# Patient Record
Sex: Male | Born: 1995 | Race: White | Hispanic: No | Marital: Single | Smoking: Never smoker
Health system: Southern US, Community
[De-identification: ages and names within clinical notes are randomized; demographics above are authoritative.]

## PROBLEM LIST (undated history)

## (undated) DIAGNOSIS — E079 Disorder of thyroid, unspecified: Secondary | ICD-10-CM

## (undated) HISTORY — DX: Disorder of thyroid, unspecified: E07.9

---
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
## 2016-10-01 ENCOUNTER — Ambulatory Visit
Admission: RE | Admit: 2016-10-01 | Discharge: 2016-10-01 | Disposition: A | Discharge status: Home / Self Care | Payer: Medicaid Other | Source: Ambulatory Visit | Attending: Pediatrics | Admitting: Pediatrics

## 2016-10-01 ENCOUNTER — Other Ambulatory Visit: Payer: Self-pay | Admitting: Pediatrics

## 2016-10-01 ENCOUNTER — Other Ambulatory Visit
Admission: RE | Admit: 2016-10-01 | Discharge: 2016-10-01 | Disposition: A | Discharge status: Home / Self Care | Payer: Medicaid Other | Source: Ambulatory Visit | Attending: Pediatrics | Admitting: Pediatrics

## 2016-10-01 DIAGNOSIS — R220 Localized swelling, mass and lump, head: Secondary | ICD-10-CM | POA: Insufficient documentation

## 2016-10-01 LAB — COMPREHENSIVE METABOLIC PANEL
ALT: 17 U/L (ref 17–63)
ANION GAP: 6 (ref 5–15)
AST: 19 U/L (ref 15–41)
Albumin: 4.9 g/dL (ref 3.5–5.0)
Alkaline Phosphatase: 60 U/L (ref 38–126)
BUN: 5 mg/dL — ABNORMAL LOW (ref 6–20)
CHLORIDE: 106 mmol/L (ref 101–111)
CO2: 27 mmol/L (ref 22–32)
Calcium: 10.1 mg/dL (ref 8.9–10.3)
Creatinine, Ser: 0.91 mg/dL (ref 0.61–1.24)
GFR calc non Af Amer: >60 mL/min (ref >60)
Glucose, Bld: 104 mg/dL — ABNORMAL HIGH (ref 65–99)
POTASSIUM: 3.1 mmol/L — AB (ref 3.5–5.1)
SODIUM: 139 mmol/L (ref 135–145)
Total Bilirubin: 1.6 mg/dL — ABNORMAL HIGH (ref 0.3–1.2)
Total Protein: 7.8 g/dL (ref 6.5–8.1)

## 2016-10-01 LAB — CBC WITH DIFFERENTIAL/PLATELET
BASOS ABS: 0.1 10*3/uL (ref 0–0.1)
Basophils Relative: 1 %
EOS PCT: 2 %
Eosinophils Absolute: 0.2 10*3/uL (ref 0–0.7)
HEMATOCRIT: 45.8 % (ref 40.0–52.0)
HEMOGLOBIN: 15.7 g/dL (ref 13.0–18.0)
Lymphocytes Relative: 27 %
Lymphs Abs: 1.9 10*3/uL (ref 1.0–3.6)
MCH: 29.7 pg (ref 26.0–34.0)
MCHC: 34.2 g/dL (ref 32.0–36.0)
MCV: 86.9 fL (ref 80.0–100.0)
Monocytes Absolute: 0.6 10*3/uL (ref 0.2–1.0)
Monocytes Relative: 9 %
Neutro Abs: 4.2 10*3/uL (ref 1.4–6.5)
Neutrophils Relative %: 61 %
Platelets: 233 10*3/uL (ref 150–440)
RBC: 5.27 MIL/uL (ref 4.40–5.90)
RDW: 12.6 % (ref 11.5–14.5)
WBC: 6.9 10*3/uL (ref 3.8–10.6)

## 2016-10-01 LAB — T4, FREE: FREE T4: 0.81 ng/dL (ref 0.61–1.12)

## 2016-10-01 LAB — LIPID PANEL
CHOL/HDL RATIO: 3.2 ratio
Cholesterol: 159 mg/dL (ref 0–200)
HDL: 50 mg/dL (ref >40)
LDL CALC: 96 mg/dL (ref 0–99)
Triglycerides: 64 mg/dL (ref <150)
VLDL: 13 mg/dL (ref 0–40)

## 2016-10-01 LAB — TSH: TSH: 13.682 u[IU]/mL — ABNORMAL HIGH (ref 0.350–4.500)

## 2017-12-07 IMAGING — CR DG SKULL COMPLETE 4+V
4 series · 4 of 4 positions shown · non-contrast
Comparison: No prior.

CLINICAL DATA: Soft tissue knot.

EXAM:
SKULL - COMPLETE 4 + VIEW

[skull pa]
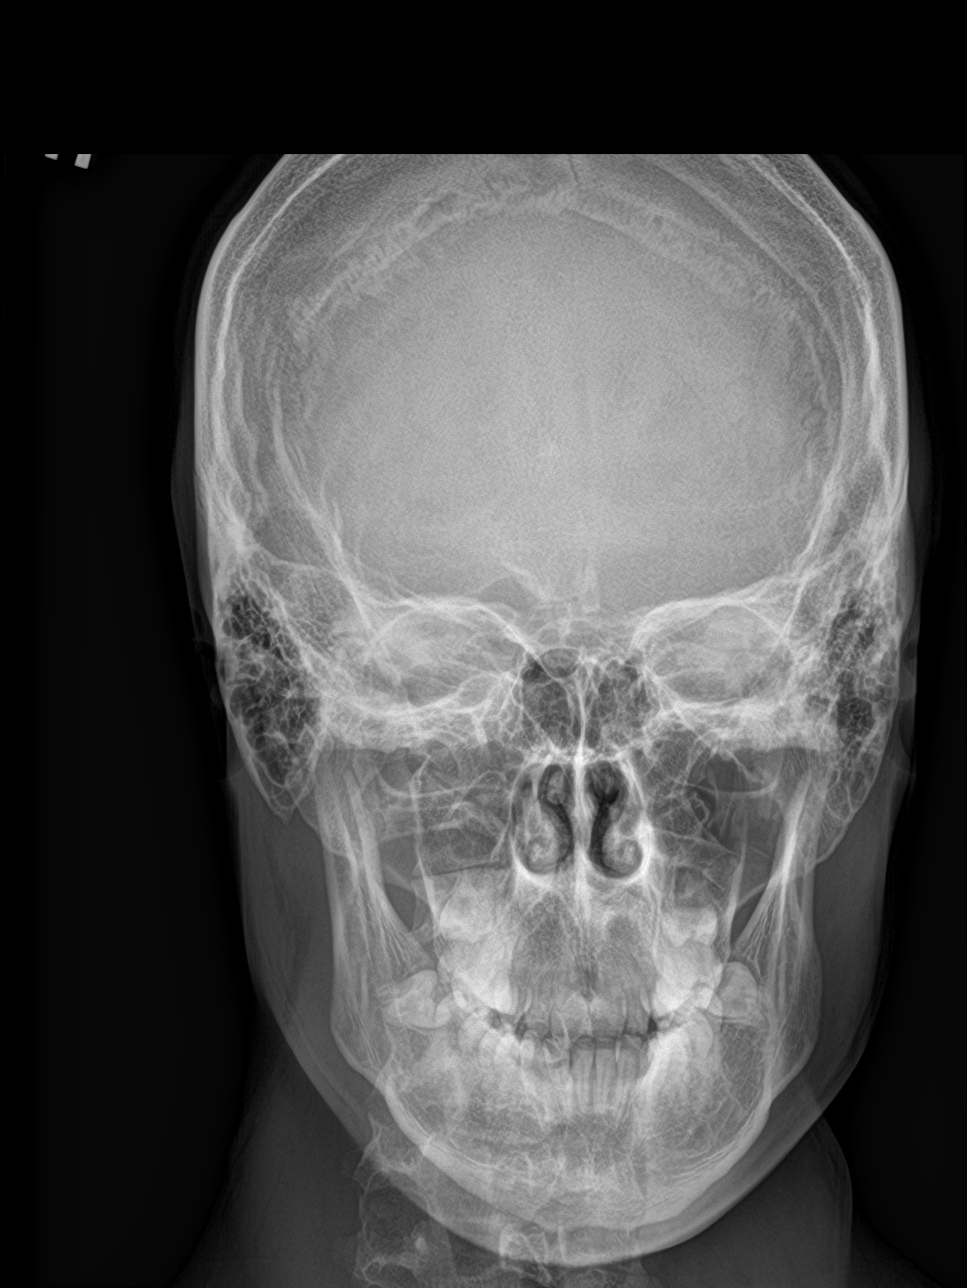

[skull lat (1 of 2)]
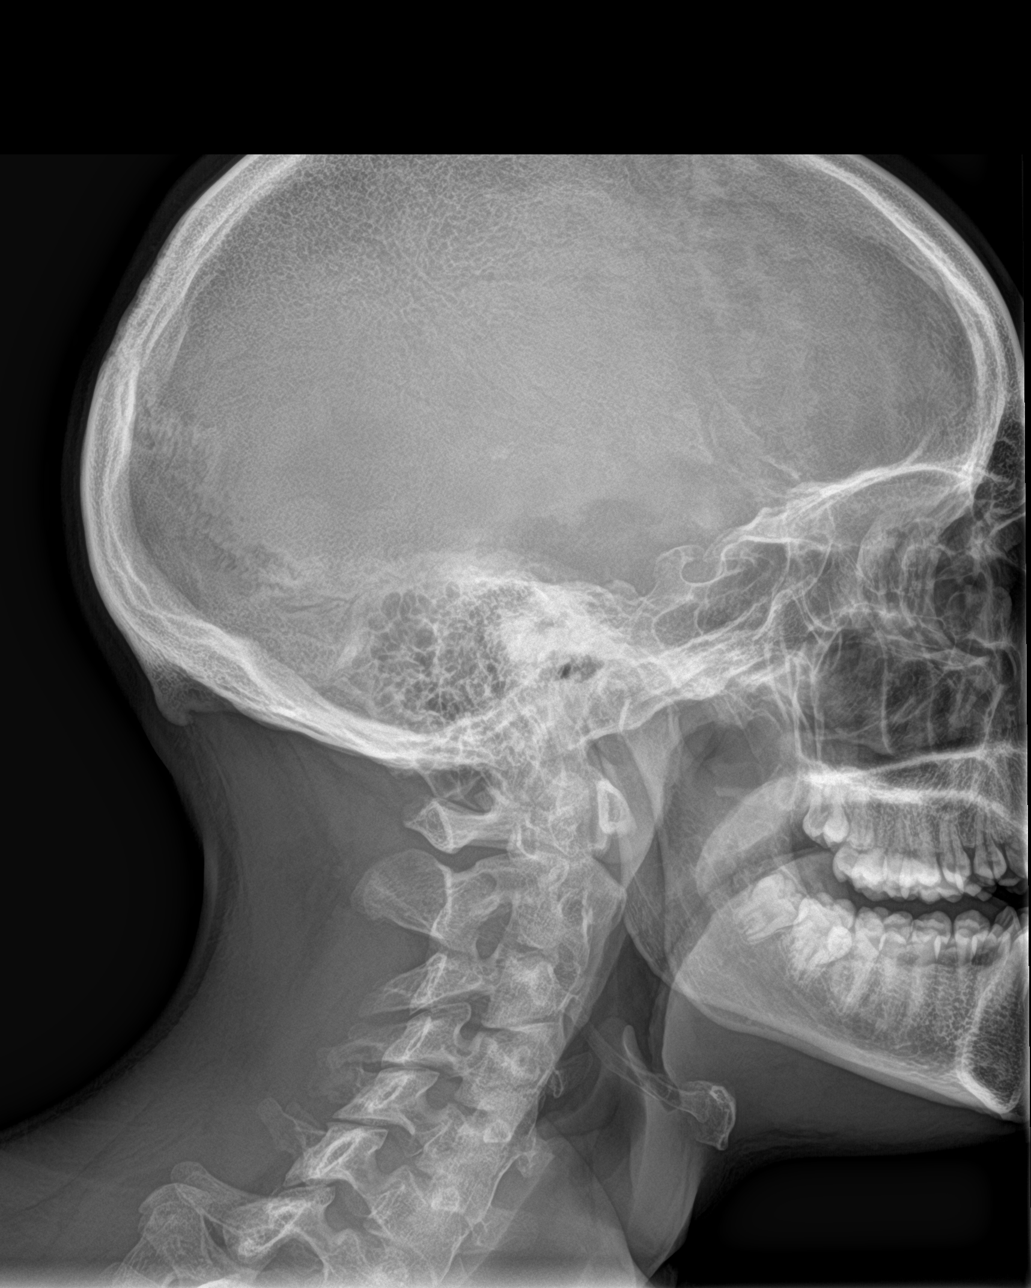

[skull towns]
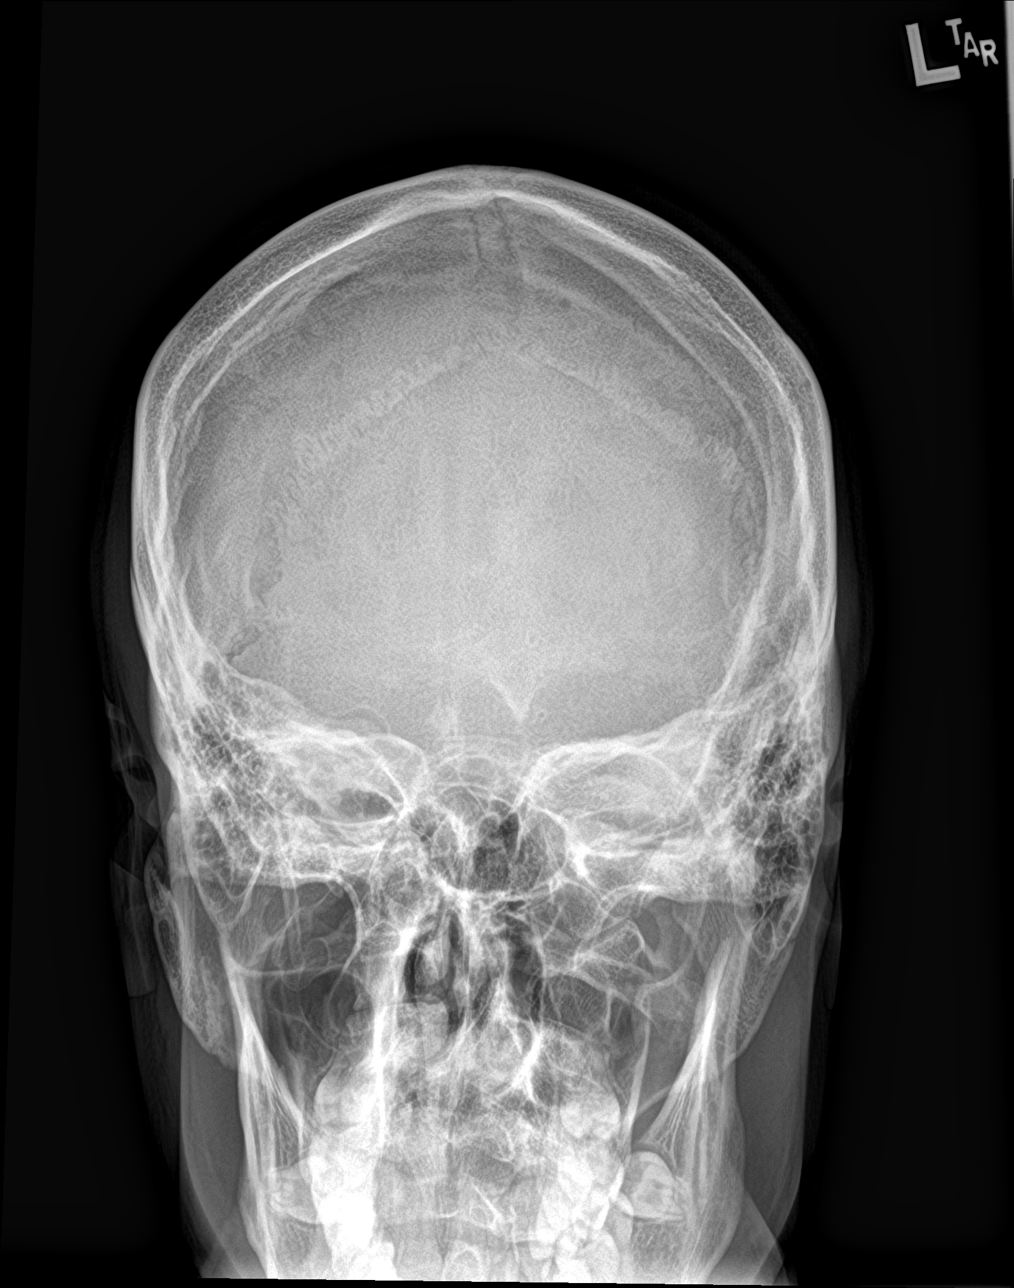

[skull lat (2 of 2)]
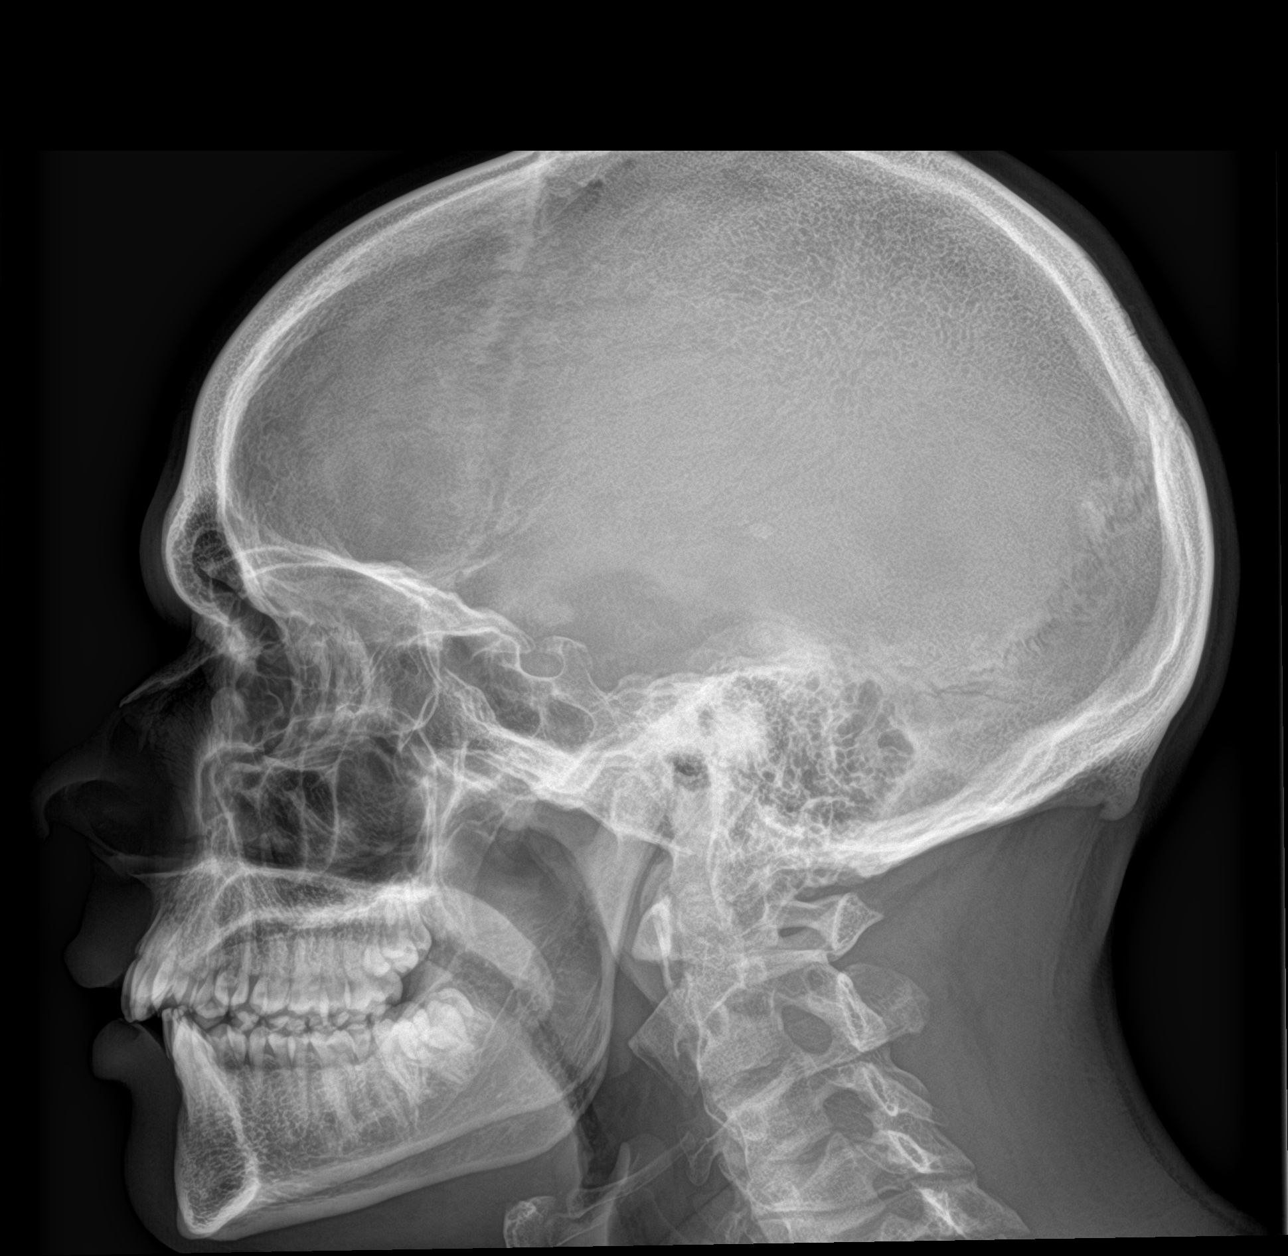

[4 of 4 positions shown; findings below may reference images not displayed]

FINDINGS: No acute soft tissue bony abnormality identified. No evidence of
fracture. No pathologic intracranial calcification. Paranasal
sinuses and mastoids are clear.
IMPRESSION: No acute abnormality..

## 2019-03-27 DIAGNOSIS — E031 Congenital hypothyroidism without goiter: Secondary | ICD-10-CM | POA: Diagnosis not present

## 2019-04-03 DIAGNOSIS — E031 Congenital hypothyroidism without goiter: Secondary | ICD-10-CM | POA: Diagnosis not present

## 2020-05-06 DIAGNOSIS — E031 Congenital hypothyroidism without goiter: Secondary | ICD-10-CM | POA: Diagnosis not present

## 2020-05-08 DIAGNOSIS — E031 Congenital hypothyroidism without goiter: Secondary | ICD-10-CM | POA: Diagnosis not present

## 2021-05-05 DIAGNOSIS — E031 Congenital hypothyroidism without goiter: Secondary | ICD-10-CM | POA: Diagnosis not present

## 2021-05-12 DIAGNOSIS — E031 Congenital hypothyroidism without goiter: Secondary | ICD-10-CM | POA: Diagnosis not present

## 2022-05-14 DIAGNOSIS — E031 Congenital hypothyroidism without goiter: Secondary | ICD-10-CM | POA: Diagnosis not present

## 2022-07-09 ENCOUNTER — Emergency Department
Admission: EM | Admit: 2022-07-09 | Discharge: 2022-07-10 | Disposition: A | Discharge status: Home / Self Care | Payer: Medicare HMO | Attending: Emergency Medicine | Admitting: Emergency Medicine

## 2022-07-09 ENCOUNTER — Emergency Department: Payer: Medicare HMO

## 2022-07-09 DIAGNOSIS — Z1152 Encounter for screening for COVID-19: Secondary | ICD-10-CM | POA: Insufficient documentation

## 2022-07-09 DIAGNOSIS — E876 Hypokalemia: Secondary | ICD-10-CM | POA: Insufficient documentation

## 2022-07-09 DIAGNOSIS — R209 Unspecified disturbances of skin sensation: Secondary | ICD-10-CM | POA: Insufficient documentation

## 2022-07-09 DIAGNOSIS — R000 Tachycardia, unspecified: Secondary | ICD-10-CM | POA: Diagnosis not present

## 2022-07-09 DIAGNOSIS — I517 Cardiomegaly: Secondary | ICD-10-CM | POA: Diagnosis not present

## 2022-07-09 DIAGNOSIS — R69 Illness, unspecified: Secondary | ICD-10-CM | POA: Diagnosis not present

## 2022-07-09 DIAGNOSIS — R531 Weakness: Secondary | ICD-10-CM | POA: Diagnosis not present

## 2022-07-09 DIAGNOSIS — F840 Autistic disorder: Secondary | ICD-10-CM | POA: Diagnosis not present

## 2022-07-09 DIAGNOSIS — R200 Anesthesia of skin: Secondary | ICD-10-CM | POA: Diagnosis not present

## 2022-07-09 LAB — BASIC METABOLIC PANEL
Anion gap: 11 (ref 5–15)
BUN: 7 mg/dL (ref 6–20)
CO2: 27 mmol/L (ref 22–32)
Calcium: 9.1 mg/dL (ref 8.9–10.3)
Chloride: 95 mmol/L — ABNORMAL LOW (ref 98–111)
Creatinine, Ser: 0.97 mg/dL (ref 0.61–1.24)
GFR, Estimated: >60 mL/min (ref >60)
Glucose, Bld: 176 mg/dL — ABNORMAL HIGH (ref 70–99)
Potassium: 2.3 mmol/L — CL (ref 3.5–5.1)
Sodium: 133 mmol/L — ABNORMAL LOW (ref 135–145)

## 2022-07-09 LAB — VITAMIN B12: Vitamin B-12: 174 pg/mL — ABNORMAL LOW (ref 180–914)

## 2022-07-09 LAB — COMPREHENSIVE METABOLIC PANEL
ALT: 18 U/L (ref 0–44)
AST: 27 U/L (ref 15–41)
Albumin: 4.1 g/dL (ref 3.5–5.0)
Alkaline Phosphatase: 73 U/L (ref 38–126)
Anion gap: 14 (ref 5–15)
BUN: 7 mg/dL (ref 6–20)
CO2: 26 mmol/L (ref 22–32)
Calcium: 9.4 mg/dL (ref 8.9–10.3)
Chloride: 92 mmol/L — ABNORMAL LOW (ref 98–111)
Creatinine, Ser: 1.03 mg/dL (ref 0.61–1.24)
GFR, Estimated: >60 mL/min (ref >60)
Glucose, Bld: 152 mg/dL — ABNORMAL HIGH (ref 70–99)
Potassium: 2.1 mmol/L — CL (ref 3.5–5.1)
Sodium: 132 mmol/L — ABNORMAL LOW (ref 135–145)
Total Bilirubin: 1.8 mg/dL — ABNORMAL HIGH (ref 0.3–1.2)
Total Protein: 7.1 g/dL (ref 6.5–8.1)

## 2022-07-09 LAB — CBC WITH DIFFERENTIAL/PLATELET
Abs Immature Granulocytes: 0.11 10*3/uL — ABNORMAL HIGH (ref 0.00–0.07)
Basophils Absolute: 0.1 10*3/uL (ref 0.0–0.1)
Basophils Relative: 0 %
Eosinophils Absolute: 0 10*3/uL (ref 0.0–0.5)
Eosinophils Relative: 0 %
HCT: 38 % — ABNORMAL LOW (ref 39.0–52.0)
Hemoglobin: 12.3 g/dL — ABNORMAL LOW (ref 13.0–17.0)
Immature Granulocytes: 1 %
Lymphocytes Relative: 12 %
Lymphs Abs: 1.8 10*3/uL (ref 0.7–4.0)
MCH: 25.8 pg — ABNORMAL LOW (ref 26.0–34.0)
MCHC: 32.4 g/dL (ref 30.0–36.0)
MCV: 79.8 fL — ABNORMAL LOW (ref 80.0–100.0)
Monocytes Absolute: 1.2 10*3/uL — ABNORMAL HIGH (ref 0.1–1.0)
Monocytes Relative: 8 %
Neutro Abs: 12.3 10*3/uL — ABNORMAL HIGH (ref 1.7–7.7)
Neutrophils Relative %: 79 %
Platelets: 430 10*3/uL — ABNORMAL HIGH (ref 150–400)
RBC: 4.76 MIL/uL (ref 4.22–5.81)
RDW: 14.8 % (ref 11.5–15.5)
WBC: 15.6 10*3/uL — ABNORMAL HIGH (ref 4.0–10.5)
nRBC: 0 % (ref 0.0–0.2)

## 2022-07-09 LAB — RESP PANEL BY RT-PCR (RSV, FLU A&B, COVID)  RVPGX2
Influenza A by PCR: NEGATIVE
Influenza B by PCR: NEGATIVE
Resp Syncytial Virus by PCR: NEGATIVE
SARS Coronavirus 2 by RT PCR: NEGATIVE

## 2022-07-09 LAB — T4, FREE: Free T4: 1.63 ng/dL — ABNORMAL HIGH (ref 0.61–1.12)

## 2022-07-09 LAB — TSH: TSH: 0.414 u[IU]/mL (ref 0.350–4.500)

## 2022-07-09 LAB — MAGNESIUM: Magnesium: 2 mg/dL (ref 1.7–2.4)

## 2022-07-09 LAB — LIPASE, BLOOD: Lipase: 27 U/L (ref 11–51)

## 2022-07-09 MED ORDER — POTASSIUM CHLORIDE 10 MEQ/100ML IV SOLN
10.0000 meq | INTRAVENOUS | Status: AC
  Administered 2022-07-09 (×2): 10 meq via INTRAVENOUS
  Filled 2022-07-09 (×2): qty 100

## 2022-07-09 MED ORDER — MAGNESIUM SULFATE 2 GM/50ML IV SOLN
2.0000 g | Freq: Once | INTRAVENOUS | Status: AC
  Administered 2022-07-09: 2 g via INTRAVENOUS
  Filled 2022-07-09: qty 50

## 2022-07-09 MED ORDER — ONDANSETRON HCL 4 MG/2ML IJ SOLN
4.0000 mg | Freq: Once | INTRAMUSCULAR | Status: DC
  Filled 2022-07-09: qty 2

## 2022-07-09 MED ORDER — POTASSIUM CHLORIDE CRYS ER 20 MEQ PO TBCR
40.0000 meq | EXTENDED_RELEASE_TABLET | Freq: Once | ORAL | Status: AC
  Administered 2022-07-09: 40 meq via ORAL
  Filled 2022-07-09: qty 2

## 2022-07-09 MED ORDER — SODIUM CHLORIDE 0.9 % IV BOLUS
1000.0000 mL | Freq: Once | INTRAVENOUS | Status: AC
  Administered 2022-07-09: 1000 mL via INTRAVENOUS

## 2022-07-09 MED ORDER — POTASSIUM CHLORIDE CRYS ER 20 MEQ PO TBCR
40.0000 meq | EXTENDED_RELEASE_TABLET | Freq: Once | ORAL | Status: AC
  Administered 2022-07-09: 40 meq via ORAL
  Filled 2022-07-09 (×2): qty 2

## 2022-07-09 NOTE — Discharge Instructions (Signed)
See attached document about low potassium and dietary options to supplement potassium.  Have your child's doctor check potassium again in the next 1 to 2 weeks.  Talk to your endocrinologist about the thyroid test which was slightly high today and recommendations to alter thyroid medications for the next few days and recheck those numbers on lab test, T4.   Thank you for choosing [place] for your health care today!  Please see your primary doctor this week for a follow up appointment.   Sometimes, in the early stages of certain disease courses it is difficult to detect in the emergency department evaluation -- so, it is important that you continue to monitor your symptoms and call your doctor right away or return to the emergency department if you develop any new or worsening symptoms.  Please go to the following website to schedule new (and existing) patient appointments:   [email or web address]  If you do not have a primary doctor try calling the following clinics to establish care:  If you have insurance:  [facility] [phone] [place] [address]   [facility]  [phone] [place] [address]   If you do not have insurance:  [facility]  [phone] [address], [place] [address]   The following is another list of primary care offices in the area who are accepting new patients at this time.  Please reach out to one of them directly and let them know you would like to schedule an appointment to follow up on an Emergency Department visit, and/or to establish a new primary care provider (PCP).  There are likely other primary care clinics in the are who are accepting new patients, but this is an excellent place to start:  [place] physician: Dr Lavon Paganini [address] [place], [place] [address] [phone]  [facility] Lead Physician: Dr Steele Sizer [address], [place], [place] [address] [phone]  [place] Physician: Dr Park Liter [address] [place], [place] [address] [phone]  [facility] Lead Physician: Dr Dewaine Oats [place], [place], [place] [address] [phone]  [facility] Physician: Dr Halina Maidens [address] [place], [place] [address] [phone]   It was my pleasure to care for you today.   Hoover Brunette Jacelyn Grip, MD

## 2022-07-09 NOTE — ED Triage Notes (Signed)
Per pt mother, pt holds his arms out and sts his hands are not bleeding. Pt is autistic and is not able to describe what he is feeling. Pt is resting in triage in a wheelchair. Pt is not showing any signs of distress.

## 2022-07-09 NOTE — ED Notes (Signed)
MD notified of potassium 2.3

## 2022-07-09 NOTE — ED Notes (Signed)
Md notified of critical lab, potassium 2.1

## 2022-07-09 NOTE — ED Provider Notes (Signed)
[facility] Provider Note    Event Date/Time   First MD Initiated Contact with Patient 07/09/22 1535     (approximate)   History   Numbness   HPI  Richard Hebert is a 27 y.o. male   Past medical history of autism who presents the emergency department with vague complaints of odd feeling in the hands, generally not feeling well.Marland Kitchen  Here with mother who is giving information as the patient is autistic and poorly communicates.   Poor p.o. intake over the last 2 days.  No fever, no respiratory infectious symptoms, no pain.  No GU symptoms.  No falls or trauma.  History was obtained via very limited by the patient, patient's mother is at bedside as independent historian gives most of the information as above.      Physical Exam   Triage Vital Signs: ED Triage Vitals  Enc Vitals Group     BP 07/09/22 1421 93/74     Pulse Rate 07/09/22 1421 (!) 129     Resp 07/09/22 1421 18     Temp 07/09/22 1421 97.8 F (36.6 C)     Temp Source 07/09/22 1421 Oral     SpO2 07/09/22 1421 100 %     Weight 07/09/22 1422 140 lb (63.5 kg)     Height --      Head Circumference --      Peak Flow --      Pain Score --      Pain Loc --      Pain Edu? --      Excl. in [place]? --     Most recent vital signs: Vitals:   07/09/22 1730 07/09/22 1926  BP: 102/71   Pulse: (!) 123   Resp: 18   Temp:  98.8 F (37.1 C)  SpO2: 99%     General: Awake, no distress.  CV:  Good peripheral perfusion.  Resp:  Normal effort.  Abd:  No distention.  Other:  No focal neurological deficits motor and sensory intact, ambulatory with gait which is abnormal at baseline, dysarthria baseline, no facial asymmetry appears comfortable.  Abdomen soft nontender lungs clear.  Skin appears warm well-perfused.   ED Results / Procedures / Treatments   Labs (all labs ordered are listed, but only abnormal results are displayed) Labs Reviewed  COMPREHENSIVE METABOLIC PANEL - Abnormal; Notable for  the following components:      Result Value   Sodium 132 (*)    Potassium 2.1 (*)    Chloride 92 (*)    Glucose, Bld 152 (*)    Total Bilirubin 1.8 (*)    All other components within normal limits  CBC WITH DIFFERENTIAL/PLATELET - Abnormal; Notable for the following components:   WBC 15.6 (*)    Hemoglobin 12.3 (*)    HCT 38.0 (*)    MCV 79.8 (*)    MCH 25.8 (*)    Platelets 430 (*)    Neutro Abs 12.3 (*)    Monocytes Absolute 1.2 (*)    Abs Immature Granulocytes 0.11 (*)    All other components within normal limits  T4, FREE - Abnormal; Notable for the following components:   Free T4 1.63 (*)    All other components within normal limits  RESP PANEL BY RT-PCR (RSV, FLU A&B, COVID)  RVPGX2  MAGNESIUM  TSH  LIPASE, BLOOD  BASIC METABOLIC PANEL  VITAMIN K09     I reviewed labs and they are notable for  hypokalemia 2.1 Free T4 slightly elevated 1.63 and white blood cell count 15.  EKG  ED ECG REPORT I, Lucillie Garfinkel, the attending physician, personally viewed and interpreted this ECG.   Date: 07/09/2022  EKG Time: 1557  Rate: 113  Rhythm: sinus tachycardia  Axis: nl  Intervals:long Qtc 600  ST&T Change: No ischemic changes    RADIOLOGY I independently reviewed and interpreted chest x-ray see no obvious focalities pneumothorax   PROCEDURES:  Critical Care performed: Yes, see critical care procedure note(s)  .Critical Care  Performed by: Lucillie Garfinkel, MD Authorized by: Lucillie Garfinkel, MD   Critical care provider statement:    Critical care time (minutes):  30   Critical care was necessary to treat or prevent imminent or life-threatening deterioration of the following conditions:  Metabolic crisis   Critical care was time spent personally by me on the following activities:  Development of treatment plan with patient or surrogate, discussions with consultants, evaluation of patient's response to treatment, examination of patient, ordering and review of laboratory  studies, ordering and review of radiographic studies, ordering and performing treatments and interventions, pulse oximetry, re-evaluation of patient's condition and review of old charts    MEDICATIONS ORDERED IN ED: Medications  ondansetron (ZOFRAN) injection 4 mg (4 mg Intravenous Patient Refused/Not Given 07/09/22 1658)  sodium chloride 0.9 % bolus 1,000 mL (0 mLs Intravenous Stopped 07/09/22 1800)  potassium chloride 10 mEq in 100 mL IVPB (0 mEq Intravenous Stopped 07/09/22 1914)  potassium chloride SA (KLOR-CON M) CR tablet 40 mEq (40 mEq Oral Given 07/09/22 1658)  potassium chloride SA (KLOR-CON M) CR tablet 40 mEq (40 mEq Oral Given 07/09/22 1814)     IMPRESSION / MDM / [place] / ED COURSE  I reviewed the triage vital signs and the nursing notes.                              Differential diagnosis includes, but is not limited to, electrolyte disturbance, anxiety, CVA, thyroid dysfunction, vitamin deficiency   The patient is on the cardiac monitor to evaluate for evidence of arrhythmia and/or significant heart rate changes.  \MDM: Markedly hypokalemic 2.1 which explains this patient's vague symptoms, other tests unremarkable, will replete potassium and recheck.  I doubt CVA given no other focal neurologic deficits on my exam.  Thyroid T4 slightly high, patient on Synthroid for low thyroid and I we will have him discuss with endocrinologist tmrw prior to continuing medications for dose adjustments.  He does not appear thyrotoxic.   Patient's presentation is most consistent with acute presentation with potential threat to life or bodily function.       FINAL CLINICAL IMPRESSION(S) / ED DIAGNOSES   Final diagnoses:  Hypokalemia     Rx / DC Orders   ED Discharge Orders     None        Note:  This document was prepared using Dragon voice recognition software and may include unintentional dictation errors.    Lucillie Garfinkel, MD 07/09/22 2037

## 2022-07-10 DIAGNOSIS — R209 Unspecified disturbances of skin sensation: Secondary | ICD-10-CM | POA: Diagnosis not present

## 2022-07-10 LAB — BASIC METABOLIC PANEL
Anion gap: 12 (ref 5–15)
BUN: 7 mg/dL (ref 6–20)
CO2: 24 mmol/L (ref 22–32)
Calcium: 8.7 mg/dL — ABNORMAL LOW (ref 8.9–10.3)
Chloride: 99 mmol/L (ref 98–111)
Creatinine, Ser: 0.89 mg/dL (ref 0.61–1.24)
GFR, Estimated: >60 mL/min (ref >60)
Glucose, Bld: 112 mg/dL — ABNORMAL HIGH (ref 70–99)
Potassium: 2.8 mmol/L — ABNORMAL LOW (ref 3.5–5.1)
Sodium: 135 mmol/L (ref 135–145)

## 2022-07-10 NOTE — ED Provider Notes (Signed)
Patient received in signout from Dr. Jacelyn Grip pending continued repletion of potassium and reassessment of metabolic panel for evaluation of symptomatic hypokalemia.  He has received quite a bit of IV and oral potassium repletion.  I reevaluate the patient prior to this last metabolic panel rechecked and he reports resolution of his symptoms.  Mother is quite anxious for discharge and requesting immediate discharge.  Patient is becoming more agitated and wants to go home.  We discussed risks and benefits of early discharge prior to confirmation of improved potassium.  We discussed the reassurance that the symptoms have abated and patient feels normal and at baseline.  Mother acknowledges the risks of continued severe hypokalemia, acknowledging that they can come back if it is really bad but still requesting discharge because he is getting antsy and is now reassured that his symptoms have resolved.  I think this is reasonable.  We discussed PCP follow-up and potassium repletion at home.   Richard Crofts, MD 07/10/22 [phone]

## 2022-07-13 ENCOUNTER — Ambulatory Visit (INDEPENDENT_AMBULATORY_CARE_PROVIDER_SITE_OTHER): Payer: Medicare HMO | Admitting: Physician Assistant

## 2022-07-13 ENCOUNTER — Encounter: Payer: Self-pay | Admitting: Physician Assistant

## 2022-07-13 VITALS — BP 110/70 | HR 125 | Temp 98.2°F | Ht 68.0 in | Wt 152.8 lb

## 2022-07-13 DIAGNOSIS — M6281 Muscle weakness (generalized): Secondary | ICD-10-CM | POA: Diagnosis not present

## 2022-07-13 DIAGNOSIS — E876 Hypokalemia: Secondary | ICD-10-CM

## 2022-07-13 DIAGNOSIS — R69 Illness, unspecified: Secondary | ICD-10-CM | POA: Diagnosis not present

## 2022-07-13 DIAGNOSIS — F840 Autistic disorder: Secondary | ICD-10-CM

## 2022-07-13 NOTE — Progress Notes (Unsigned)
New patient visit   Patient: Richard Hebert   DOB: 11-07-1995   26 y.o. Male  MRN: [id number] Visit Date: 07/13/2022  Today's healthcare provider: Mardene Speak, PA-C   Chief Complaint  Patient presents with   Follow-up   Neurologic Problem    gate concerns    Subjective    Richard Hebert is a 27 y.o. male who presents today as a new patient to establish care.   HPI     Neurologic Problem    Additional comments: gait concerns         Comments   ED F/U from 07/09/22       Last edited by Sula Rumple on 07/13/2022  1:59 PM.     Pt presents to the clinic for outpatient potassium repletion.  Last BMP showed potassium 2.1 Free T4 was slightly elevated 1.63, WBC was 15 EKG from 07/09/22 showed sinus tachycardia , no ischemic changes   Pt was seen at ED on 07/09/22 for symptomatic hypokalemia. Pt received IV and oral potassium repletion. Pt wanted an early d/c prior to confirmation of improved potassium.  Per mother, her son recovered to his baseline. Pt was d/c with diagnosis of hypokalemia, not CVA. Advised to recheck potassium, discuss with endocrinology a dose adjustment for levothyroxine.   Pt is accompanied by his mother, who is historian and provides most of the information. Pt is autistic and cannot properly communicate. Reports having weakness in his arms/hands and problems with gait.  Pt was [facility] at [facility] clinic on 07/09/22 for hand pain x 1 wk. Per mother, pt woke up with b/l hand numbness/ tingling sensation, hand and arm weakness and anorexia. Pt was sent to ED for neuro evaluation.   Past Medical History:  Diagnosis Date   Thyroid disease    The histories are not reviewed yet. Please review them in the "History" navigator section and refresh this [place]. Family Status  Relation Name Status   Mat Aunt  (Not Specified)   Family History  Problem Relation Age of Onset   Heart disease Maternal Aunt    Social History   Socioeconomic History    Marital status: Single    Spouse name: Not on file   Number of children: Not on file   Years of education: Not on file   Highest education level: Not on file  Occupational History   Not on file  Tobacco Use   Smoking status: Never   Smokeless tobacco: Never  Substance and Sexual Activity   Alcohol use: Never   Drug use: Never   Sexual activity: Not on file  Other Topics Concern   Not on file  Social History Narrative   Not on file   Social Determinants of Health   Financial Resource Strain: Not on file  Food Insecurity: Not on file  Transportation Needs: Not on file  Physical Activity: Not on file  Stress: Not on file  Social Connections: Not on file   Outpatient Medications Prior to Visit  Medication Sig   levothyroxine (SYNTHROID) 112 MCG tablet Take 112 mcg by mouth daily before breakfast.   No facility-administered medications prior to visit.   No Known Allergies   There is no immunization history on file for this patient.  Health Maintenance  Topic Date Due   Medicare Annual Wellness (AWV)  Never done   COVID-19 Vaccine (1) Never done   HPV VACCINES (1 - Male 2-dose series) Never done   HIV Screening  Never done   Hepatitis C Screening  Never done   DTaP/Tdap/Td (1 - Tdap) Never done   INFLUENZA VACCINE  Never done    Patient Care Team: Mardene Speak, PA-C as PCP - General (Physician Assistant)  Review of Systems  All other systems reviewed and are negative. Except See HPI  {Labs  Heme  Chem  Endocrine  Serology  Results Review (optional):23779}   Objective    BP 110/70   Pulse (!) 125   Temp 98.2 F (36.8 C)   Ht 5\' 8"  (1.727 m)   Wt 152 lb 12.8 oz (69.3 kg)   BMI 23.23 kg/m  {Show previous vital signs (optional):23777}  Physical Exam Vitals reviewed.  Constitutional:      General: He is not in acute distress.    Appearance: Normal appearance. He is not diaphoretic.  HENT:     Head: Normocephalic and atraumatic.     Nose:  Nose normal.  Eyes:     General: No scleral icterus.       Right eye: No discharge.        Left eye: No discharge.     Extraocular Movements: Extraocular movements intact.     Conjunctiva/sclera: Conjunctivae normal.     Pupils: Pupils are equal, round, and reactive to light.  Cardiovascular:     Rate and Rhythm: Normal rate and regular rhythm.     Pulses: Normal pulses.     Heart sounds: Normal heart sounds. No murmur heard. Pulmonary:     Effort: Pulmonary effort is normal. No respiratory distress.     Breath sounds: Normal breath sounds. No wheezing or rhonchi.  Musculoskeletal:        General: No swelling or tenderness. Normal range of motion.     Cervical back: Normal range of motion and neck supple.     Right lower leg: No edema.     Left lower leg: No edema.  Lymphadenopathy:     Cervical: No cervical adenopathy.  Skin:    General: Skin is warm and dry.     Capillary Refill: Capillary refill takes less than 2 seconds.     Findings: No rash.  Neurological:     Mental Status: He is alert and oriented to person, place, and time. Mental status is at baseline.     Cranial Nerves: No cranial nerve deficit.     Motor: Weakness present.     Gait: Gait abnormal.     Comments: Attempted to complete neuro exam but pt was not cooperative.  Psychiatric:        Behavior: Behavior normal.        Thought Content: Thought content normal.        Judgment: Judgment normal.     Depression Screen    07/13/2022    1:57 PM 07/13/2022    1:56 PM 07/13/2022    1:55 PM  PHQ 2/9 Scores  PHQ - 2 Score 0 0 0  PHQ- 9 Score 3     No results found for any visits on 07/13/22.  Assessment & Plan     1. Low serum potassium level Last potassium level was 2.1 on 07/09/22, Magnesium WNL EKG was WNL, CXR was wo acute abnormalities Could be due to electrolyte disturbance, anxiety, CVA, thyroid dysfunction, vit deficiency. Will recheck - Basic Metabolic Panel (BMET) Will continue to replete  potassium Pt was advised to contact endocrinology for levothyroxine 's dose adjustment Pt needs to see [place] in 2 weeks?  2.  Muscle weakness of all 4 extremities Per mother, pt is not at his baseline.  Per chart notes, pt was at his baseline Neuro exam was attempted, no visible abnormalities , except some weakness in arms and in hands. Vitals normal - Ambulatory referral to Neurology Advised to adhere to healthy diet, adequate hydration and daily activities Will FU The patient was advised to call back or seek an in-person evaluation if the symptoms worsen or if the condition fails to improve as anticipated.  3. Establish care Will referred to psychiatry /psychology depending who will agree to manage autistic pt Will check if we could refer pt to [facility] program at [facility] Per mother, she [facility] [place] for day care  I discussed the assessment and treatment plan with the patient. The patient was provided an opportunity to ask questions and all were answered. The patient agreed with the plan and demonstrated an understanding of the instructions.  The entirety of the information documented in the History of Present Illness, Review of Systems and Physical Exam were personally obtained by me. Portions of this information were initially documented by the CMA and reviewed by me for thoroughness and accuracy.   Debera Lat, [facility], MMS [facility] [phone] (phone) [phone] (fax)

## 2022-07-14 DIAGNOSIS — F840 Autistic disorder: Secondary | ICD-10-CM | POA: Insufficient documentation

## 2022-07-14 DIAGNOSIS — E876 Hypokalemia: Secondary | ICD-10-CM

## 2022-07-14 HISTORY — DX: Hypokalemia: E87.6

## 2022-07-14 LAB — BASIC METABOLIC PANEL
BUN/Creatinine Ratio: 3 — ABNORMAL LOW (ref 9–20)
BUN: 3 mg/dL — ABNORMAL LOW (ref 6–20)
CO2: 21 mmol/L (ref 20–29)
Calcium: 9.8 mg/dL (ref 8.7–10.2)
Chloride: 96 mmol/L (ref 96–106)
Creatinine, Ser: 0.9 mg/dL (ref 0.76–1.27)
Glucose: 114 mg/dL — ABNORMAL HIGH (ref 70–99)
Potassium: 3 mmol/L — ABNORMAL LOW (ref 3.5–5.2)
Sodium: 139 mmol/L (ref 134–144)
eGFR: 122 mL/min/{1.73_m2} (ref >59)

## 2022-07-14 MED ORDER — POTASSIUM CHLORIDE CRYS ER 20 MEQ PO TBCR: 20.0000 meq | EXTENDED_RELEASE_TABLET | Freq: Every day | ORAL | 0 refills | Status: DC

## 2022-07-22 ENCOUNTER — Emergency Department: Payer: Medicare HMO

## 2022-07-22 ENCOUNTER — Inpatient Hospital Stay
Admission: EM | Admit: 2022-07-22 | Discharge: 2022-07-26 | DRG: 558 | Disposition: A | Discharge status: Other Acute Inpatient Hospital | Payer: Medicare HMO | Attending: Internal Medicine | Admitting: Internal Medicine

## 2022-07-22 ENCOUNTER — Other Ambulatory Visit: Payer: Self-pay

## 2022-07-22 ENCOUNTER — Encounter: Payer: Self-pay | Admitting: Emergency Medicine

## 2022-07-22 DIAGNOSIS — E039 Hypothyroidism, unspecified: Secondary | ICD-10-CM | POA: Diagnosis not present

## 2022-07-22 DIAGNOSIS — Z8249 Family history of ischemic heart disease and other diseases of the circulatory system: Secondary | ICD-10-CM

## 2022-07-22 DIAGNOSIS — R000 Tachycardia, unspecified: Secondary | ICD-10-CM | POA: Diagnosis not present

## 2022-07-22 DIAGNOSIS — I959 Hypotension, unspecified: Secondary | ICD-10-CM | POA: Diagnosis present

## 2022-07-22 DIAGNOSIS — E876 Hypokalemia: Secondary | ICD-10-CM | POA: Diagnosis not present

## 2022-07-22 DIAGNOSIS — M6282 Rhabdomyolysis: Secondary | ICD-10-CM

## 2022-07-22 DIAGNOSIS — D518 Other vitamin B12 deficiency anemias: Secondary | ICD-10-CM | POA: Diagnosis not present

## 2022-07-22 DIAGNOSIS — Z1152 Encounter for screening for COVID-19: Secondary | ICD-10-CM | POA: Diagnosis not present

## 2022-07-22 DIAGNOSIS — A419 Sepsis, unspecified organism: Secondary | ICD-10-CM | POA: Diagnosis not present

## 2022-07-22 DIAGNOSIS — E871 Hypo-osmolality and hyponatremia: Secondary | ICD-10-CM | POA: Diagnosis not present

## 2022-07-22 DIAGNOSIS — R339 Retention of urine, unspecified: Secondary | ICD-10-CM | POA: Diagnosis present

## 2022-07-22 DIAGNOSIS — E860 Dehydration: Secondary | ICD-10-CM | POA: Diagnosis not present

## 2022-07-22 DIAGNOSIS — R29898 Other symptoms and signs involving the musculoskeletal system: Secondary | ICD-10-CM | POA: Diagnosis not present

## 2022-07-22 DIAGNOSIS — T796XXA Traumatic ischemia of muscle, initial encounter: Secondary | ICD-10-CM

## 2022-07-22 DIAGNOSIS — E031 Congenital hypothyroidism without goiter: Secondary | ICD-10-CM | POA: Diagnosis present

## 2022-07-22 DIAGNOSIS — E538 Deficiency of other specified B group vitamins: Secondary | ICD-10-CM | POA: Diagnosis not present

## 2022-07-22 DIAGNOSIS — R531 Weakness: Principal | ICD-10-CM

## 2022-07-22 DIAGNOSIS — R109 Unspecified abdominal pain: Secondary | ICD-10-CM | POA: Diagnosis not present

## 2022-07-22 DIAGNOSIS — E230 Hypopituitarism: Secondary | ICD-10-CM | POA: Diagnosis not present

## 2022-07-22 DIAGNOSIS — R69 Illness, unspecified: Secondary | ICD-10-CM | POA: Diagnosis not present

## 2022-07-22 DIAGNOSIS — F840 Autistic disorder: Secondary | ICD-10-CM | POA: Diagnosis present

## 2022-07-22 DIAGNOSIS — R296 Repeated falls: Secondary | ICD-10-CM | POA: Diagnosis not present

## 2022-07-22 DIAGNOSIS — D75838 Other thrombocytosis: Secondary | ICD-10-CM | POA: Insufficient documentation

## 2022-07-22 DIAGNOSIS — L309 Dermatitis, unspecified: Secondary | ICD-10-CM | POA: Diagnosis present

## 2022-07-22 DIAGNOSIS — E639 Nutritional deficiency, unspecified: Secondary | ICD-10-CM | POA: Diagnosis not present

## 2022-07-22 DIAGNOSIS — Z79890 Hormone replacement therapy (postmenopausal): Secondary | ICD-10-CM

## 2022-07-22 DIAGNOSIS — R0902 Hypoxemia: Secondary | ICD-10-CM | POA: Diagnosis not present

## 2022-07-22 DIAGNOSIS — R9431 Abnormal electrocardiogram [ECG] [EKG]: Secondary | ICD-10-CM | POA: Diagnosis not present

## 2022-07-22 DIAGNOSIS — R21 Rash and other nonspecific skin eruption: Secondary | ICD-10-CM | POA: Diagnosis not present

## 2022-07-22 DIAGNOSIS — E872 Acidosis, unspecified: Secondary | ICD-10-CM | POA: Diagnosis not present

## 2022-07-22 DIAGNOSIS — R29818 Other symptoms and signs involving the nervous system: Secondary | ICD-10-CM | POA: Diagnosis not present

## 2022-07-22 DIAGNOSIS — D529 Folate deficiency anemia, unspecified: Secondary | ICD-10-CM | POA: Insufficient documentation

## 2022-07-22 DIAGNOSIS — Z8669 Personal history of other diseases of the nervous system and sense organs: Secondary | ICD-10-CM | POA: Diagnosis not present

## 2022-07-22 DIAGNOSIS — R338 Other retention of urine: Secondary | ICD-10-CM | POA: Diagnosis not present

## 2022-07-22 DIAGNOSIS — D519 Vitamin B12 deficiency anemia, unspecified: Secondary | ICD-10-CM

## 2022-07-22 DIAGNOSIS — M419 Scoliosis, unspecified: Secondary | ICD-10-CM | POA: Diagnosis not present

## 2022-07-22 HISTORY — DX: Traumatic ischemia of muscle, initial encounter: T79.6XXA

## 2022-07-22 LAB — CK: Total CK: 930 U/L — ABNORMAL HIGH (ref 49–397)

## 2022-07-22 LAB — COMPREHENSIVE METABOLIC PANEL
ALT: 42 U/L (ref 0–44)
AST: 47 U/L — ABNORMAL HIGH (ref 15–41)
Albumin: 4.3 g/dL (ref 3.5–5.0)
Alkaline Phosphatase: 66 U/L (ref 38–126)
Anion gap: 15 (ref 5–15)
BUN: 19 mg/dL (ref 6–20)
CO2: 21 mmol/L — ABNORMAL LOW (ref 22–32)
Calcium: 9.3 mg/dL (ref 8.9–10.3)
Chloride: 97 mmol/L — ABNORMAL LOW (ref 98–111)
Creatinine, Ser: 1.04 mg/dL (ref 0.61–1.24)
GFR, Estimated: >60 mL/min (ref >60)
Glucose, Bld: 114 mg/dL — ABNORMAL HIGH (ref 70–99)
Potassium: 2.9 mmol/L — ABNORMAL LOW (ref 3.5–5.1)
Sodium: 133 mmol/L — ABNORMAL LOW (ref 135–145)
Total Bilirubin: 2.1 mg/dL — ABNORMAL HIGH (ref 0.3–1.2)
Total Protein: 7.6 g/dL (ref 6.5–8.1)

## 2022-07-22 LAB — URINALYSIS, ROUTINE W REFLEX MICROSCOPIC
Bacteria, UA: NONE SEEN
Bilirubin Urine: NEGATIVE
Glucose, UA: NEGATIVE mg/dL
Hgb urine dipstick: NEGATIVE
Ketones, ur: 5 mg/dL — AB
Leukocytes,Ua: NEGATIVE
Nitrite: NEGATIVE
Protein, ur: 30 mg/dL — AB
Specific Gravity, Urine: 1.026 (ref 1.005–1.030)
Squamous Epithelial / HPF: NONE SEEN /HPF (ref 0–5)
pH: 5 (ref 5.0–8.0)

## 2022-07-22 LAB — CBC WITH DIFFERENTIAL/PLATELET
Abs Immature Granulocytes: 0.13 10*3/uL — ABNORMAL HIGH (ref 0.00–0.07)
Basophils Absolute: 0 10*3/uL (ref 0.0–0.1)
Basophils Relative: 0 %
Eosinophils Absolute: 0 10*3/uL (ref 0.0–0.5)
Eosinophils Relative: 0 %
HCT: 33.1 % — ABNORMAL LOW (ref 39.0–52.0)
Hemoglobin: 10.9 g/dL — ABNORMAL LOW (ref 13.0–17.0)
Immature Granulocytes: 1 %
Lymphocytes Relative: 11 %
Lymphs Abs: 1.7 10*3/uL (ref 0.7–4.0)
MCH: 26.1 pg (ref 26.0–34.0)
MCHC: 32.9 g/dL (ref 30.0–36.0)
MCV: 79.4 fL — ABNORMAL LOW (ref 80.0–100.0)
Monocytes Absolute: 0.8 10*3/uL (ref 0.1–1.0)
Monocytes Relative: 5 %
Neutro Abs: 12.4 10*3/uL — ABNORMAL HIGH (ref 1.7–7.7)
Neutrophils Relative %: 83 %
Platelets: 466 10*3/uL — ABNORMAL HIGH (ref 150–400)
RBC: 4.17 MIL/uL — ABNORMAL LOW (ref 4.22–5.81)
RDW: 16.3 % — ABNORMAL HIGH (ref 11.5–15.5)
WBC: 15 10*3/uL — ABNORMAL HIGH (ref 4.0–10.5)
nRBC: 0 % (ref 0.0–0.2)

## 2022-07-22 LAB — RESP PANEL BY RT-PCR (RSV, FLU A&B, COVID)  RVPGX2
Influenza A by PCR: NEGATIVE
Influenza B by PCR: NEGATIVE
Resp Syncytial Virus by PCR: NEGATIVE
SARS Coronavirus 2 by RT PCR: NEGATIVE

## 2022-07-22 LAB — LIPASE, BLOOD: Lipase: 31 U/L (ref 11–51)

## 2022-07-22 LAB — T4, FREE: Free T4: 1.55 ng/dL — ABNORMAL HIGH (ref 0.61–1.12)

## 2022-07-22 LAB — TSH: TSH: 0.398 u[IU]/mL (ref 0.350–4.500)

## 2022-07-22 LAB — SEDIMENTATION RATE: Sed Rate: 17 mm/hr — ABNORMAL HIGH (ref 0–15)

## 2022-07-22 LAB — PROCALCITONIN: Procalcitonin: 0.2 ng/mL

## 2022-07-22 LAB — LACTIC ACID, PLASMA
Lactic Acid, Venous: 2.2 mmol/L (ref 0.5–1.9)
Lactic Acid, Venous: 1.4 mmol/L (ref 0.5–1.9)

## 2022-07-22 LAB — MAGNESIUM: Magnesium: 2.7 mg/dL — ABNORMAL HIGH (ref 1.7–2.4)

## 2022-07-22 MED ORDER — TRAZODONE HCL 50 MG PO TABS
25.0000 mg | ORAL_TABLET | Freq: Every evening | ORAL | Status: DC | PRN
  Administered 2022-07-22: 25 mg via ORAL
  Filled 2022-07-22: qty 1

## 2022-07-22 MED ORDER — SODIUM CHLORIDE 0.9 % IV BOLUS
1000.0000 mL | Freq: Once | INTRAVENOUS | Status: AC
  Administered 2022-07-22: 1000 mL via INTRAVENOUS

## 2022-07-22 MED ORDER — POTASSIUM CHLORIDE CRYS ER 20 MEQ PO TBCR
40.0000 meq | EXTENDED_RELEASE_TABLET | Freq: Once | ORAL | Status: AC
  Administered 2022-07-22: 40 meq via ORAL
  Filled 2022-07-22: qty 2

## 2022-07-22 MED ORDER — POTASSIUM CHLORIDE CRYS ER 20 MEQ PO TBCR: 20.0000 meq | EXTENDED_RELEASE_TABLET | Freq: Every day | ORAL | Status: DC

## 2022-07-22 MED ORDER — SODIUM CHLORIDE 0.9 % IV BOLUS
500.0000 mL | Freq: Once | INTRAVENOUS | Status: AC
  Administered 2022-07-22: 500 mL via INTRAVENOUS

## 2022-07-22 MED ORDER — POTASSIUM CHLORIDE IN NACL 20-0.9 MEQ/L-% IV SOLN
INTRAVENOUS | Status: DC
  Filled 2022-07-22: qty 1000

## 2022-07-22 MED ORDER — ACETAMINOPHEN 650 MG RE SUPP: 650.0000 mg | Freq: Four times a day (QID) | RECTAL | Status: DC | PRN

## 2022-07-22 MED ORDER — IOHEXOL 300 MG/ML  SOLN
100.0000 mL | Freq: Once | INTRAMUSCULAR | Status: AC | PRN
  Administered 2022-07-22: 100 mL via INTRAVENOUS

## 2022-07-22 MED ORDER — ONDANSETRON HCL 4 MG PO TABS: 4.0000 mg | ORAL_TABLET | Freq: Four times a day (QID) | ORAL | Status: DC | PRN

## 2022-07-22 MED ORDER — ENOXAPARIN SODIUM 40 MG/0.4ML IJ SOSY
40.0000 mg | PREFILLED_SYRINGE | INTRAMUSCULAR | Status: DC
  Administered 2022-07-23 – 2022-07-26 (×4): 40 mg via SUBCUTANEOUS
  Filled 2022-07-22 (×4): qty 0.4

## 2022-07-22 MED ORDER — SODIUM CHLORIDE 0.9 % IV SOLN
2.0000 g | Freq: Once | INTRAVENOUS | Status: AC
  Administered 2022-07-22: 2 g via INTRAVENOUS
  Filled 2022-07-22: qty 12.5

## 2022-07-22 MED ORDER — MAGNESIUM HYDROXIDE 400 MG/5ML PO SUSP: 30.0000 mL | Freq: Every day | ORAL | Status: DC | PRN

## 2022-07-22 MED ORDER — ACETAMINOPHEN 325 MG PO TABS: 650.0000 mg | ORAL_TABLET | Freq: Four times a day (QID) | ORAL | Status: DC | PRN

## 2022-07-22 MED ORDER — LEVOTHYROXINE SODIUM 112 MCG PO TABS
112.0000 ug | ORAL_TABLET | Freq: Every day | ORAL | Status: DC
  Administered 2022-07-23: 112 ug via ORAL
  Filled 2022-07-22: qty 1

## 2022-07-22 MED ORDER — ONDANSETRON HCL 4 MG/2ML IJ SOLN: 4.0000 mg | Freq: Four times a day (QID) | INTRAMUSCULAR | Status: DC | PRN

## 2022-07-22 MED ORDER — VANCOMYCIN HCL 1500 MG/300ML IV SOLN
1500.0000 mg | Freq: Once | INTRAVENOUS | Status: AC
  Administered 2022-07-22: 1500 mg via INTRAVENOUS
  Filled 2022-07-22: qty 300

## 2022-07-22 MED ORDER — POTASSIUM CHLORIDE 10 MEQ/100ML IV SOLN
10.0000 meq | INTRAVENOUS | Status: AC
  Administered 2022-07-22 (×3): 10 meq via INTRAVENOUS
  Filled 2022-07-22 (×3): qty 100

## 2022-07-22 NOTE — ED Notes (Signed)
Pt care taken, has an iv that was placed by iv team,fluid and potassium are infusing., need bladder scan for urine retention.

## 2022-07-22 NOTE — ED Notes (Addendum)
Pt in wheelchair, this rn and mom attempted to transfer pt to stretcher- pt is dead weight and unable to hold himself up which is different from baseline per mom.  Called for help, transferred to stretcher.

## 2022-07-22 NOTE — ED Provider Notes (Signed)
[facility] Provider Note    Event Date/Time   First MD Initiated Contact with Patient 07/22/22 1643     (approximate)   History   Weakness   HPI  Richard Hebert is a 27 y.o. male  with h/o autism here with weakness, difficulty ambulating. Pt has had progressively worsening weakness over the past several weeks. He had been seen here and diagnosed with hypokalemia and dehydration. Since then, he's had progressive weakness and now is essentially unable to support himself. He's had decreased appetite. He's had chills and sweats. No known h/o recent sick contacts. No recent medication changes. Unable to provide additional history 2/2 autism.      Physical Exam   Triage Vital Signs: ED Triage Vitals  Enc Vitals Group     BP 07/22/22 1427 103/63     Pulse Rate 07/22/22 1427 67     Resp 07/22/22 1427 16     Temp 07/22/22 1427 97.6 F (36.4 C)     Temp Source 07/22/22 1427 Axillary     SpO2 07/22/22 1427 100 %     Weight 07/22/22 1431 152 lb 12.5 oz (69.3 kg)     Height 07/22/22 1431 5\' 8"  (1.727 m)     Head Circumference --      Peak Flow --      Pain Score --      Pain Loc --      Pain Edu? --      Excl. in GC? --     Most recent vital signs: Vitals:   07/22/22 2030 07/22/22 2133  BP: 98/62 (!) 117/59  Pulse: (!) 117 (!) 121  Resp: 18 20  Temp: 98.2 F (36.8 C)   SpO2: 100% 100%     General: Awake, appears  CV:  Good peripheral perfusion. Tachycardic.  Resp:  Normal effort. Slight tachypnea. Lungs clear bilaterally. Abd:  No distention.  Other:  Arms held in in slight flexion at elbows and wrists, but able to be relaxed. No overt muscular TTP. Raised, sandpaper-like rash to b/l UE and LE.    ED Results / Procedures / Treatments   Labs (all labs ordered are listed, but only abnormal results are displayed) Labs Reviewed  COMPREHENSIVE METABOLIC PANEL - Abnormal; Notable for the following components:      Result Value   Sodium 133  (*)    Potassium 2.9 (*)    Chloride 97 (*)    CO2 21 (*)    Glucose, Bld 114 (*)    AST 47 (*)    Total Bilirubin 2.1 (*)    All other components within normal limits  CBC WITH DIFFERENTIAL/PLATELET - Abnormal; Notable for the following components:   WBC 15.0 (*)    RBC 4.17 (*)    Hemoglobin 10.9 (*)    HCT 33.1 (*)    MCV 79.4 (*)    RDW 16.3 (*)    Platelets 466 (*)    Neutro Abs 12.4 (*)    Abs Immature Granulocytes 0.13 (*)    All other components within normal limits  CK - Abnormal; Notable for the following components:   Total CK 930 (*)    All other components within normal limits  LACTIC ACID, PLASMA - Abnormal; Notable for the following components:   Lactic Acid, Venous 2.2 (*)    All other components within normal limits  MAGNESIUM - Abnormal; Notable for the following components:   Magnesium 2.7 (*)  All other components within normal limits  URINALYSIS, ROUTINE W REFLEX MICROSCOPIC - Abnormal; Notable for the following components:   Color, Urine AMBER (*)    APPearance HAZY (*)    Ketones, ur 5 (*)    Protein, ur 30 (*)    All other components within normal limits  SEDIMENTATION RATE - Abnormal; Notable for the following components:   Sed Rate 17 (*)    All other components within normal limits  T4, FREE - Abnormal; Notable for the following components:   Free T4 1.55 (*)    All other components within normal limits  RESP PANEL BY RT-PCR (RSV, FLU A&B, COVID)  RVPGX2  CULTURE, BLOOD (ROUTINE X 2)  CULTURE, BLOOD (ROUTINE X 2)  LIPASE, BLOOD  LACTIC ACID, PLASMA  PROCALCITONIN  TSH  C-REACTIVE PROTEIN  HIV ANTIBODY (ROUTINE TESTING W REFLEX)  BASIC METABOLIC PANEL  CBC  CK     EKG Sinus tachycardia, VR 120. PR 112, QRS 90, QTc 520. No acute st elevations, nonspecific ST changes.    RADIOLOGY CXR: Clear CT A/P: Negative with exception of marked bladder distension CT Head: NAICA     I also independently reviewed and agree with  radiologist interpretations.   PROCEDURES:  Critical Care performed: Yes, see critical care procedure note(s)  .Critical Care  Performed by: Duffy Bruce, MD Authorized by: Duffy Bruce, MD   Critical care provider statement:    Critical care time (minutes):  30   Critical care time was exclusive of:  Separately billable procedures and treating other patients   Critical care was necessary to treat or prevent imminent or life-threatening deterioration of the following conditions:  Cardiac failure, circulatory failure, respiratory failure and sepsis   Critical care was time spent personally by me on the following activities:  Development of treatment plan with patient or surrogate, discussions with consultants, evaluation of patient's response to treatment, examination of patient, ordering and review of laboratory studies, ordering and review of radiographic studies, ordering and performing treatments and interventions, pulse oximetry, re-evaluation of patient's condition and review of old charts     MEDICATIONS ORDERED IN ED: Medications  vancomycin (VANCOREADY) IVPB 1500 mg/300 mL (1,500 mg Intravenous New Bag/Given 07/22/22 2206)  levothyroxine (SYNTHROID) tablet 112 mcg (has no administration in time range)  potassium chloride SA (KLOR-CON M) CR tablet 20 mEq (has no administration in time range)  enoxaparin (LOVENOX) injection 40 mg (has no administration in time range)  0.9 % NaCl with KCl 20 mEq/ L  infusion (has no administration in time range)  acetaminophen (TYLENOL) tablet 650 mg (has no administration in time range)    Or  acetaminophen (TYLENOL) suppository 650 mg (has no administration in time range)  traZODone (DESYREL) tablet 25 mg (25 mg Oral Given 07/22/22 2255)  magnesium hydroxide (MILK OF MAGNESIA) suspension 30 mL (has no administration in time range)  ondansetron (ZOFRAN) tablet 4 mg (has no administration in time range)    Or  ondansetron (ZOFRAN) injection  4 mg (has no administration in time range)  sodium chloride 0.9 % bolus 1,000 mL (1,000 mLs Intravenous New Bag/Given 07/22/22 1915)  sodium chloride 0.9 % bolus 1,000 mL (1,000 mLs Intravenous New Bag/Given 07/22/22 1904)  potassium chloride SA (KLOR-CON M) CR tablet 40 mEq (40 mEq Oral Given 07/22/22 1904)  potassium chloride 10 mEq in 100 mL IVPB (0 mEq Intravenous Stopped 07/22/22 2220)  iohexol (OMNIPAQUE) 300 MG/ML solution 100 mL (100 mLs Intravenous Contrast Given 07/22/22 1826)  sodium chloride 0.9 % bolus 500 mL (500 mLs Intravenous New Bag/Given 07/22/22 2051)  ceFEPIme (MAXIPIME) 2 g in sodium chloride 0.9 % 100 mL IVPB (0 g Intravenous Stopped 07/22/22 2121)     IMPRESSION / MDM / ASSESSMENT AND PLAN / ED COURSE  I reviewed the triage vital signs and the nursing notes.                              Differential diagnosis includes, but is not limited to, myositis/rhabdo, neuromuscular do, weakness 2/2 sepsis (UTI, PNA), ACS, lyte abnormality  Patient's presentation is most consistent with acute presentation with potential threat to life or bodily function.  The patient is on the cardiac monitor to evaluate for evidence of arrhythmia and/or significant heart rate changes.  27 yo M with h/o autism here with weakness, tremors, and recurrent weakness. Pt exam is concerning for possible underlying myositis, with rash, rhabdo, weakness. DDx includes viral process, also occult UTI with dehydration and secondary rhabdo/dehydration. Pt is tachycardic, with leukocytosis and tachypnea-ABX started with 30 cc/kg fluids. ESR, CRP sent. CK>900. Will admit to medicine for stabilization, hydration, further work-up. Broad imaging sent as above and reviewed. CT does show urinary retention - he has intact bl strength of LE, no signs of cauda equina. Denies any OTC or antihistamine/antimuscarinic use. Foley placed.  FINAL CLINICAL IMPRESSION(S) / ED DIAGNOSES   Final diagnoses:  Weakness  Non-traumatic  rhabdomyolysis  Sepsis without acute organ dysfunction, due to unspecified organism [facility])     Rx / DC Orders   ED Discharge Orders     None        Note:  This document was prepared using Dragon voice recognition software and may include unintentional dictation errors.   Duffy Bruce, MD 07/22/22 [phone]

## 2022-07-22 NOTE — ED Notes (Signed)
Bladder scan showed >936mL  14Fr foley in place.

## 2022-07-22 NOTE — Consult Note (Signed)
PHARMACY -  BRIEF ANTIBIOTIC NOTE   Pharmacy has received consult(s) for cefepime + vancomycin from an ED provider.  The patient's profile has been reviewed for ht/wt/allergies/indication/available labs.    One time order(s) placed for cefepime 2g IV x1. Vancomycin 1500mg  IV x1  Further antibiotics/pharmacy consults should be ordered by admitting physician if indicated.                       Thank you, Darrick Penna 07/22/2022  7:19 PM

## 2022-07-22 NOTE — ED Notes (Signed)
Multiple attempts made by multiple staff for IV access without success. Stat Consult for IV team placed at this time.

## 2022-07-22 NOTE — ED Triage Notes (Signed)
Pt here with his mother with difficulty walking. Mother states his legs will not support him, he will not eat and he has not urinated, last day was 2 days ago. Pt denies fever.

## 2022-07-22 NOTE — ED Provider Triage Note (Signed)
Emergency Medicine Provider Triage Evaluation Note  Richard Hebert , a 27 y.o. male  was evaluated in triage.  Pt complains of not eating or drinking, normally able to wwalk but can't walk now due to full body weakness. No constitutional symptoms. No urine output in 1.5 days. No pain. Nop abd pain, n/v/d.  Review of Systems  Positive: weakness Negative: pain  Physical Exam  BP 103/63   Pulse 67   Temp 97.6 F (36.4 C) (Axillary)   Resp 16   SpO2 100%  Gen:   Awake, no distress   Resp:  Normal effort  MSK:   Moves extremities without difficulty  Other:    Medical Decision Making  Medically screening exam initiated at 2:30 PM.  Appropriate orders placed.  Richard Hebert was informed that the remainder of the evaluation will be completed by another provider, this initial triage assessment does not replace that evaluation, and the importance of remaining in the ED until their evaluation is complete.     Marquette Old, PA-C 07/22/22 1432

## 2022-07-23 ENCOUNTER — Inpatient Hospital Stay: Payer: Medicare HMO

## 2022-07-23 DIAGNOSIS — E039 Hypothyroidism, unspecified: Secondary | ICD-10-CM | POA: Insufficient documentation

## 2022-07-23 DIAGNOSIS — D75838 Other thrombocytosis: Secondary | ICD-10-CM | POA: Insufficient documentation

## 2022-07-23 DIAGNOSIS — E872 Acidosis, unspecified: Secondary | ICD-10-CM | POA: Insufficient documentation

## 2022-07-23 DIAGNOSIS — D519 Vitamin B12 deficiency anemia, unspecified: Secondary | ICD-10-CM | POA: Insufficient documentation

## 2022-07-23 DIAGNOSIS — R29898 Other symptoms and signs involving the musculoskeletal system: Secondary | ICD-10-CM | POA: Insufficient documentation

## 2022-07-23 DIAGNOSIS — R531 Weakness: Secondary | ICD-10-CM | POA: Diagnosis not present

## 2022-07-23 DIAGNOSIS — R338 Other retention of urine: Secondary | ICD-10-CM | POA: Insufficient documentation

## 2022-07-23 DIAGNOSIS — D518 Other vitamin B12 deficiency anemias: Secondary | ICD-10-CM | POA: Insufficient documentation

## 2022-07-23 DIAGNOSIS — I959 Hypotension, unspecified: Secondary | ICD-10-CM | POA: Insufficient documentation

## 2022-07-23 HISTORY — DX: Acidosis, unspecified: E87.20

## 2022-07-23 HISTORY — DX: Other thrombocytosis: D75.838

## 2022-07-23 LAB — LACTATE DEHYDROGENASE: LDH: 231 U/L — ABNORMAL HIGH (ref 98–192)

## 2022-07-23 LAB — CK: Total CK: 438 U/L — ABNORMAL HIGH (ref 49–397)

## 2022-07-23 LAB — BASIC METABOLIC PANEL
Anion gap: 14 (ref 5–15)
BUN: 24 mg/dL — ABNORMAL HIGH (ref 6–20)
CO2: 16 mmol/L — ABNORMAL LOW (ref 22–32)
Calcium: 8.6 mg/dL — ABNORMAL LOW (ref 8.9–10.3)
Chloride: 105 mmol/L (ref 98–111)
Creatinine, Ser: 0.98 mg/dL (ref 0.61–1.24)
GFR, Estimated: >60 mL/min (ref >60)
Glucose, Bld: 88 mg/dL (ref 70–99)
Potassium: 4.1 mmol/L (ref 3.5–5.1)
Sodium: 135 mmol/L (ref 135–145)

## 2022-07-23 LAB — CBC
HCT: 29.5 % — ABNORMAL LOW (ref 39.0–52.0)
Hemoglobin: 9.5 g/dL — ABNORMAL LOW (ref 13.0–17.0)
MCH: 26.3 pg (ref 26.0–34.0)
MCHC: 32.2 g/dL (ref 30.0–36.0)
MCV: 81.7 fL (ref 80.0–100.0)
Platelets: 378 10*3/uL (ref 150–400)
RBC: 3.61 MIL/uL — ABNORMAL LOW (ref 4.22–5.81)
RDW: 16.5 % — ABNORMAL HIGH (ref 11.5–15.5)
WBC: 19.8 10*3/uL — ABNORMAL HIGH (ref 4.0–10.5)
nRBC: 0 % (ref 0.0–0.2)

## 2022-07-23 LAB — C-REACTIVE PROTEIN: CRP: 1 mg/dL — ABNORMAL HIGH (ref <1.0)

## 2022-07-23 LAB — FOLATE: Folate: 1.7 ng/mL — ABNORMAL LOW (ref >5.9)

## 2022-07-23 LAB — VITAMIN B12: Vitamin B-12: >7500 pg/mL — ABNORMAL HIGH (ref 180–914)

## 2022-07-23 LAB — HIV ANTIBODY (ROUTINE TESTING W REFLEX): HIV Screen 4th Generation wRfx: NONREACTIVE

## 2022-07-23 MED ORDER — MIDAZOLAM HCL 2 MG/2ML IJ SOLN: 2.0000 mg | Freq: Once | INTRAMUSCULAR | Status: DC

## 2022-07-23 MED ORDER — LORAZEPAM 2 MG/ML IJ SOLN
1.0000 mg | Freq: Once | INTRAMUSCULAR | Status: AC
  Administered 2022-07-23: 1 mg via INTRAVENOUS
  Filled 2022-07-23: qty 1

## 2022-07-23 MED ORDER — CYANOCOBALAMIN 1000 MCG/ML IJ SOLN
1000.0000 ug | Freq: Every day | INTRAMUSCULAR | Status: DC
  Administered 2022-07-23: 1000 ug via INTRAMUSCULAR
  Filled 2022-07-23 (×2): qty 1

## 2022-07-23 MED ORDER — LEVOTHYROXINE SODIUM 100 MCG PO TABS
100.0000 ug | ORAL_TABLET | Freq: Every day | ORAL | Status: DC
  Administered 2022-07-24: 100 ug via ORAL
  Filled 2022-07-23: qty 1

## 2022-07-23 MED ORDER — MIDAZOLAM HCL 2 MG/2ML IJ SOLN
2.0000 mg | Freq: Once | INTRAMUSCULAR | Status: AC
  Administered 2022-07-23: 2 mg via INTRAVENOUS
  Filled 2022-07-23: qty 2

## 2022-07-23 MED ORDER — POTASSIUM CHLORIDE 20 MEQ PO PACK
40.0000 meq | PACK | Freq: Once | ORAL | Status: AC
  Administered 2022-07-23: 40 meq via ORAL
  Filled 2022-07-23: qty 2

## 2022-07-23 MED ORDER — SODIUM CHLORIDE 0.45 % IV SOLN
INTRAVENOUS | Status: DC
  Filled 2022-07-23 (×3): qty 75

## 2022-07-23 NOTE — ED Notes (Signed)
Per pt mom, pt cannot swallow pills, Dr Roosevelt Locks notified

## 2022-07-23 NOTE — ED Notes (Signed)
Pt gone to MRI. Will be transported to room after MRI. Beth, RN aware and will be expecting pt when MRI is complete

## 2022-07-23 NOTE — ED Notes (Signed)
Pharmacy contacted x2 for med

## 2022-07-23 NOTE — H&P (Signed)
[place]   PATIENT NAME: Richard Hebert    MR#:  [id number]  DATE OF BIRTH:  11--1997  DATE OF ADMISSION:  07/22/2022  PRIMARY CARE PHYSICIAN: Mardene Speak, PA-C   Patient is coming from: Home  REQUESTING/REFERRING PHYSICIAN: Duffy Bruce, MD  CHIEF COMPLAINT:   Chief Complaint  Patient presents with   Weakness    HISTORY OF PRESENT ILLNESS:  Richard Hebert is a 27 y.o. Caucasian male with medical history significant for autistic disorder, growth hormone deficiency and hypothyroidism, who presented to the emergency room with acute onset of generalized weakness with subsequent difficulty with ambulation.  He fell twice per his mother.  He sustained bruises to his lower extremities.  No head injuries.  He has been having progressive worsening weakness over the past several weeks.  He was seen here a couple weeks ago and was diagnosed with hypokalemia and dehydration.  He has been having diminished appetite.  He developed erythematous rash on both lower extremity as well as upper extremities with significant pruritus.  No fever or chills.  No nausea or vomiting or diarrhea.  No cough or wheezing or dyspnea.  No chest pain or palpitations.  He was slightly agitated in the ER.  He denies any headache or paresthesias or blurred vision.  ED Course: When he came to the ER, vital signs were within normal and later BP was 85/60 with heart rate of 109.  Labs revealed hypokalemia of 2.9 with sodium 133 and chloride 97 BUN 19 creatinine 1.04 and glucose 114.  CO2 is 21.  LFT is showed AST 47 and total bili 2.1.  Magnesium was 2.7.  CK was 930 and CBC showed leukocytosis of 15 with neutrophilia and thrombocytosis of 466 with anemia.  UA was unremarkable.  Influenza antigens and COVID-19 PCR as well as RSV PCR came back negative. EKG as reviewed by me : Sinus tachycardia with rate 120 with prolonged QT interval. Imaging: Noncontrast head CT scan revealed asymmetrically hyperdense  appearance of the left sigmoid/transverse sinus junction that may be artifactual but could be seen with dural venous sinus thrombosis.  Abdominal pelvic CT scan revealed marked distention of the urinary bladder to the level of the umbilicus with no acute abnormalities.  Portable chest ray showed no acute cardiopulmonary disease.  The patient was given 2.5 L bolus of IV normal saline, IV vancomycin and cefepime, 40 mill [place] p.o. potassium chloride.  He will be admitted to a medical telemetry bed for further evaluation and management. PAST MEDICAL HISTORY:   Past Medical History:  Diagnosis Date   Thyroid disease   -Autistic disorder - History of growth hormone deficiency.  PAST SURGICAL HISTORY:  History reviewed. No pertinent surgical history.  No reported previous surgeries.  SOCIAL HISTORY:   Social History   Tobacco Use   Smoking status: Never   Smokeless tobacco: Never  Substance Use Topics   Alcohol use: Never    FAMILY HISTORY:   Family History  Problem Relation Age of Onset   Heart disease Maternal Aunt     DRUG ALLERGIES:  No Known Allergies  REVIEW OF SYSTEMS:   ROS As per history of present illness. All pertinent systems were reviewed above. Constitutional, HEENT, cardiovascular, respiratory, GI, GU, musculoskeletal, neuro, psychiatric, endocrine, integumentary and hematologic systems were reviewed and are otherwise negative/unremarkable except for positive findings mentioned above in the HPI.   MEDICATIONS AT HOME:   Prior to Admission medications   Medication Sig Start  Date End Date Taking? Authorizing Provider  levothyroxine (SYNTHROID) 112 MCG tablet Take 112 mcg by mouth daily before breakfast. 01/17/18  Yes [provider]  potassium chloride SA (KLOR-CON M) 20 MEQ tablet Take 1 tablet (20 mEq total) by mouth daily. 07/14/22  Yes Ostwalt, Edmon Crape, PA-C      VITAL SIGNS:  Blood pressure 121/62, pulse (!) 117, temperature 98.1 F (36.7 C),  resp. rate 20, height 5\' 8"  (1.727 m), weight 69.3 kg, SpO2 100 %.  PHYSICAL EXAMINATION:  Physical Exam  GENERAL:  27 y.o.-year-old Caucasian male patient lying in the bed with no acute distress.  Globally confused. EYES: Pupils equal, round, reactive to light and accommodation. No scleral icterus. Extraocular muscles intact.  HEENT: Head atraumatic, normocephalic. Oropharynx and nasopharynx clear.  NECK:  Supple, no jugular venous distention. No thyroid enlargement, no tenderness.  LUNGS: Normal breath sounds bilaterally, no wheezing, rales,rhonchi or crepitation. No use of accessory muscles of respiration.  CARDIOVASCULAR: Regular rate and rhythm, S1, S2 normal. No murmurs, rubs, or gallops.  ABDOMEN: Soft, nondistended, nontender. Bowel sounds present. No organomegaly or mass.  EXTREMITIES: No pedal edema, cyanosis, or clubbing.  NEUROLOGIC: Cranial nerves II through XII are intact. Muscle strength 4/5 in all extremities.  He is moving all 4 extremities.  Sensation intact. Gait not checked.  PSYCHIATRIC: The patient is globally confused.  Flat affect and good eye contact. SKIN: Scattered erythematous sandpaperlike blanching eruption over both lower extremity as well as both forearms.  He has abrasions secondary to pruritus. LABORATORY PANEL:   CBC Recent Labs  Lab 07/22/22 1433  WBC 15.0*  HGB 10.9*  HCT 33.1*  PLT 466*   ------------------------------------------------------------------------------------------------------------------  Chemistries  Recent Labs  Lab 07/22/22 1433  NA 133*  K 2.9*  CL 97*  CO2 21*  GLUCOSE 114*  BUN 19  CREATININE 1.04  CALCIUM 9.3  MG 2.7*  AST 47*  ALT 42  ALKPHOS 66  BILITOT 2.1*   ------------------------------------------------------------------------------------------------------------------  Cardiac Enzymes No results for input(s): "TROPONINI" in the last 168  hours. ------------------------------------------------------------------------------------------------------------------  RADIOLOGY:  CT ABDOMEN PELVIS W CONTRAST  Result Date: 07/22/2022 CLINICAL DATA:  Abdomen pain EXAM: CT ABDOMEN AND PELVIS WITH CONTRAST TECHNIQUE: Multidetector CT imaging of the abdomen and pelvis was performed using the standard protocol following bolus administration of intravenous contrast. RADIATION DOSE REDUCTION: This exam was performed according to the departmental dose-optimization program which includes automated exposure control, adjustment of the mA and/or kV according to patient size and/or use of iterative reconstruction technique. CONTRAST:  07/24/2022 OMNIPAQUE IOHEXOL 300 MG/ML  SOLN COMPARISON:  None Available. FINDINGS: Lower chest: No acute abnormality. Hepatobiliary: No focal liver abnormality is seen. No gallstones, gallbladder wall thickening, or biliary dilatation. Pancreas: Unremarkable. No pancreatic ductal dilatation or surrounding inflammatory changes. Spleen: Normal in size without focal abnormality. Adrenals/Urinary Tract: Adrenal glands are unremarkable. Kidneys are normal, without renal calculi, focal lesion, or hydronephrosis. Bladder is markedly distended to the level of the umbilicus. Stomach/Bowel: The stomach is nonenlarged. Fluid-filled nondistended small bowel. Mild air distension of sigmoid and transverse colon with moderate stool. No acute bowel wall thickening. Negative appendix. Vascular/Lymphatic: No significant vascular findings are present. No enlarged abdominal or pelvic lymph nodes. Reproductive: Prostate is unremarkable. Other: No abdominal wall hernia or abnormality. No abdominopelvic ascites. Musculoskeletal: No acute or significant osseous findings. IMPRESSION: 1. No CT evidence for acute intra-abdominal or pelvic abnormality. 2. Marked distention of the urinary bladder to the level of the umbilicus. Electronically Signed  By: Donavan Foil  M.D.   On: 07/22/2022 21:26   DG Chest Portable 1 View  Result Date: 07/22/2022 CLINICAL DATA:  Weakness EXAM: PORTABLE CHEST 1 VIEW COMPARISON:  Chest x-ray 06/29/2022 FINDINGS: The heart size and mediastinal contours are within normal limits. Both lungs are clear. The visualized skeletal structures are unremarkable. IMPRESSION: No active disease. Electronically Signed   By: Ronney Asters M.D.   On: 07/22/2022 19:20   CT HEAD WO CONTRAST (5MM)  Result Date: 07/22/2022 CLINICAL DATA:  Full body weakness EXAM: CT HEAD WITHOUT CONTRAST TECHNIQUE: Contiguous axial images were obtained from the base of the skull through the vertex without intravenous contrast. RADIATION DOSE REDUCTION: This exam was performed according to the departmental dose-optimization program which includes automated exposure control, adjustment of the mA and/or kV according to patient size and/or use of iterative reconstruction technique. COMPARISON:  None Available. FINDINGS: Brain: No evidence of acute infarction, hemorrhage, hydrocephalus, extra-axial collection or mass lesion/mass effect. Vascular: Asymmetrically hyperdense appearance of the left sigmoid/transverse sinus junction may be artifactual, but could also be seen with dural venous sinus thrombosis. Recommend further evaluation with a CT venogram. Skull: Normal. Negative for fracture or focal lesion. Sinuses/Orbits: No middle ear or mastoid effusion. Paranasal sinuses are clear. Orbits are unremarkable. Other: None. IMPRESSION: Asymmetrically hyperdense appearance of the left sigmoid/transverse sinus junction may be artifactual, but could also be seen with dural venous sinus thrombosis. Recommend further evaluation with a CT venogram. Electronically Signed   By: Marin Roberts M.D.   On: 07/22/2022 19:02      IMPRESSION AND PLAN:  Assessment and Plan: * Rhabdomyolysis - The patient be admitted to a medical telemetry bed. - We will continue hydration with IV normal  saline. - We will follow his CK and MB. - Given his associated dermatitis, differential diagnosis would include dermatomyositis specially with progressive nature of his muscle weakness. - We will add ANA, LDH. - Neurology consult will be obtained. - I notified Dr. Leonel Ramsay about the patient. - PT consult will be obtained. - He may ultimately need outpatient rheumatology consult as well.  Hypotension - This is secondary to volume depletion and dehydration. - Elevated lactic acid is likely secondary to the same and it is improving with hydration. - I will hold off on further IV antibiotic therapy for now due to lack of clear infectious etiology.  He had blood cultures drawn.  Hypothyroidism - We will continue Synthroid.  Hypokalemia - This could certainly be contributing to his muscle weakness. - We will aggressively replace potassium and follow.       DVT prophylaxis: Lovenox.  Advanced Care Planning:  Code Status: full code  Family Communication:  The plan of care was discussed in details with the patient (and his mother). I answered all questions. The patient agreed to proceed with the above mentioned plan. Further management will depend upon hospital course. Disposition Plan: Back to previous home environment Consults called: Neurology. All the records are reviewed and case discussed with ED provider.  Status is: Inpatient    At the time of the admission, it appears that the appropriate admission status for this patient is inpatient.  This is judged to be reasonable and necessary in order to provide the required intensity of service to ensure the patient's safety given the presenting symptoms, physical exam findings and initial radiographic and laboratory data in the context of comorbid conditions.  The patient requires inpatient status due to high intensity of service, high risk  of further deterioration and high frequency of surveillance required.  I certify that at the  time of admission, it is my clinical judgment that the patient will require inpatient hospital care extending more than 2 midnights.                            Dispo: The patient is from: Home              Anticipated d/c is to: Home              Patient currently is not medically stable to d/c.              Difficult to place patient: No  Hannah Beat M.D on 07/23/2022 at 2:28 AM  [facility]   From 7 PM-7 AM, contact night-coverage [email or web address]  CC: Primary care physician; Debera Lat, PA-C

## 2022-07-23 NOTE — Assessment & Plan Note (Addendum)
-  The patient be admitted to a medical telemetry bed. - We will continue hydration with IV normal saline. - We will follow his CK and MB. - Given his associated dermatitis, differential diagnosis would include dermatomyositis specially with progressive nature of his muscle weakness. - We will add ANA, LDH. - Neurology consult will be obtained. - I notified Dr. Leonel Ramsay about the patient. - PT consult will be obtained. - He may ultimately need outpatient rheumatology consult as well.

## 2022-07-23 NOTE — Consult Note (Addendum)
Neurology Consultation Reason for Consult: Weakness Referring Physician: Roosevelt Locks, D  CC: Weakness  History is obtained from: Mother  HPI: BENEDETTO RYDER is a 27 y.o. male with a history of autism who presents with difficulty walking that has been stepwise progressive over the past few weeks.  Approximately 2 to 3 weeks ago, he came to her complaining of his hands and having problems with them.  She notes that he was not self stimming with his fingers like he normally does.  She actually contacted her physician who has referred her to neurology.  That initial change was sudden, overnight.  It was then stable for several weeks with a sudden stepwise worsening yesterday which brought her into the emergency department.  She now describes that he is unable to walk.  Of note, he has fallen several times because of this recently.  Typically he does walk reasonably well, though not normally.  He walks on the toes of his right foot but is able to walk independently.  Past Medical History:  Diagnosis Date   Thyroid disease      Family History  Problem Relation Age of Onset   Heart disease Maternal Aunt      Social History:  reports that he has never smoked. He has never used smokeless tobacco. He reports that he does not drink alcohol and does not use drugs.   Exam: Current vital signs: BP (!) 111/52   Pulse (!) 120   Temp 97.6 F (36.4 C) (Axillary)   Resp 19   Ht 5\' 8"  (1.727 m)   Wt 69.3 kg   SpO2 98%   BMI 23.23 kg/m  Vital signs in last 24 hours: Temp:  [97.6 F (36.4 C)-98.2 F (36.8 C)] 97.6 F (36.4 C) (01/25 1157) Pulse Rate:  [67-122] 120 (01/25 1230) Resp:  [13-29] 19 (01/25 1230) BP: (85-121)/(48-66) 111/52 (01/25 1230) SpO2:  [96 %-100 %] 98 % (01/25 1230) Weight:  [69.3 kg] 69.3 kg (01/24 1431)   Physical Exam  Appears well-developed and well-nourished.   Neuro: Mental Status: Patient is awake, alert, he is able to tell me his name, identifies mother.  He  is able to follow some very simple commands, but no even moderately complex commands.  Mother states is baseline.  Cranial Nerves: II: Blinks to threat bilaterally. Pupils are equal, round, and reactive to light.   III,IV, VI: EOMI without ptosis or diploplia.  V: Facial sensation is symmetric to temperature VII: Facial movement is symmetric. Motor: He has increased tone in his right lower extremity with the right ankle held in a flexed position, formal strength testing was very difficult but he appears to have some weakness of his arms bilaterally.  He holds his arms flexed at the elbows and wrists, but is able to straighten them when asked.  He appears to have some hand weakness. Sensory: He reports being able to feel and it feeling normal in all extremities, though the reliability of these answers is markedly in question.  He responds to mild tingling type sensation in all four extremities. Deep Tendon Reflexes: Reflexes are diminished throughout Cerebellar: Does not perform     I have reviewed labs in epic and the results pertinent to this consultation are: CK 930 -> 430 ESR 17 TSH 0.398 T41.55  I have reviewed the images obtained:MRI brain - negative.   Impression: 27 year old male with congenital hypothyroidism, autism who presents with worsening gait dysfunction, rash, fever.  He does have an elevated free T4,  but a normal TSH.  On exam, he is slightly inconsistent, but does appear to have some muscle weakness, unfortunately his sensory exam is very difficult.  His CK elevation is relatively mild and decreasing.  With his recent falls, it is possible this represents a traumatic rhabdo with gait dysfunction worsening because of pain with ambulation.  With arm and leg symptoms, I would be concerned about a cervical spine therefore I do think that imaging of this would be prudent.  He has decreased reflexes of unclear significance given his longstanding hypothyroidism.  This  progression of symptoms would not be characteristic of dermatomyositis.   Recommendations: 1) MRI brain, cervical spine 2) trend CK 3) B12, B1, folate 4) neurology will continue to follow  Roland Rack, MD [facility] [phone]  If 7pm- 7am, please page neurology on call as listed in [place].

## 2022-07-23 NOTE — TOC Initial Note (Signed)
Transition of Care [facility]) - Initial/Assessment Note    Patient Details  Name: Richard Hebert MRN: [id number] Date of Birth: 1996/05/12  Transition of [facility]) CM/SW Contact:    Beverly Sessions, RN Phone Number: 07/23/2022, 4:27 PM  Clinical Narrative:                  Patient is autistic and mother Ms Marineau is his caregiver   Admitted INO:[id number]  Admitted from: baseline lives at home with his mother, and a gentlemen that she also provides care for.   Mother states at at discharge they will be staying with her friend Kieth Brightly.  She is not sure of the exact address states that it is on [address] [place] PCP: [place]  Current home health/prior home health/DME:NA  Mother states at baseline patient is able to walk independently on his tip toes without any DME.  Over the past 2 weeks he has been getting weaker and weaker and now no longer able to stand  PT OT eval pending [facility]/idd resources added to AVS Mother states that she will follow up with Medicaid for PCS services    Expected Discharge Plan: [place] Barriers to Discharge: Continued Medical Work up   Patient Goals and CMS Choice            Expected Discharge Plan and Services                                              Prior Living Arrangements/Services   Lives with:: Parents, Roommate Patient language and need for interpreter reviewed:: Yes Do you feel safe going back to the place where you live?: Yes            Criminal Activity/Legal Involvement Pertinent to Current Situation/Hospitalization: No - Comment as needed  Activities of Daily Living Home Assistive Devices/Equipment: None ADL Screening (condition at time of admission) Patient's cognitive ability adequate to safely complete daily activities?: No Is the patient deaf or have difficulty hearing?: No Does the patient have difficulty seeing, even when wearing glasses/contacts?: No Does the  patient have difficulty concentrating, remembering, or making decisions?: Yes Patient able to express need for assistance with ADLs?: Yes Does the patient have difficulty dressing or bathing?: Yes Independently performs ADLs?: No Communication: Independent Dressing (OT): Needs assistance Is this a change from baseline?: Pre-admission baseline Grooming: Needs assistance Is this a change from baseline?: Pre-admission baseline Feeding: Independent Bathing: Needs assistance Is this a change from baseline?: Pre-admission baseline Toileting: Needs assistance Is this a change from baseline?: Change from baseline, expected to last <3 days In/Out Bed: Dependent Is this a change from baseline?: Change from baseline, expected to last <3 days Walks in Home: Dependent Is this a change from baseline?: Change from baseline, expected to last <3 days Does the patient have difficulty walking or climbing stairs?: No Weakness of Legs: Both Weakness of Arms/Hands: Both  Permission Sought/Granted                  Emotional Assessment              Admission diagnosis:  Rhabdomyolysis [M62.82] Weakness [R53.1] Non-traumatic rhabdomyolysis [M62.82] Sepsis without acute organ dysfunction, due to unspecified organism [facility]) [A41.9] Patient Active Problem List   Diagnosis Date Noted   Hypothyroidism 07/23/2022   Weakness 07/23/2022  Hypotension 61/60/7371   Metabolic acidosis 12/23/9483   Reactive thrombocytosis 07/23/2022   Vitamin B12 deficiency (dietary) anemia 07/23/2022   Acute urinary retention 07/23/2022   Leg weakness, bilateral 07/23/2022   Rhabdomyolysis 07/22/2022   Autism 07/14/2022   Hypokalemia 07/14/2022   PCP:  Mardene Speak, PA-C Pharmacy:   [place], [place] - [address] [place] [address] Phone: [phone] Fax: [phone]     Social Determinants of Health (SDOH) Social History: SDOH Screenings   Alcohol Screen: Low  Risk  (07/13/2022)  Depression (PHQ2-9): Low Risk  (07/13/2022)  Tobacco Use: Low Risk  (07/22/2022)   SDOH Interventions:     Readmission Risk Interventions     No data to display

## 2022-07-23 NOTE — Assessment & Plan Note (Signed)
-  This could certainly be contributing to his muscle weakness. - We will aggressively replace potassium and follow.

## 2022-07-23 NOTE — Care Management Important Message (Signed)
Important Message  Patient Details  Name: Richard Hebert MRN: [id number] Date of Birth: [birth year]   Medicare Important Message Given:  N/A - LOS <3 / Initial given by admissions     Dannette Barbara 07/23/2022, 3:12 PM

## 2022-07-23 NOTE — Assessment & Plan Note (Signed)
-  We will continue Synthroid. 

## 2022-07-23 NOTE — Assessment & Plan Note (Signed)
-  This is secondary to volume depletion and dehydration. - Elevated lactic acid is likely secondary to the same and it is improving with hydration. - I will hold off on further IV antibiotic therapy for now due to lack of clear infectious etiology.  He had blood cultures drawn.

## 2022-07-23 NOTE — Hospital Course (Addendum)
Richard Hebert is a 27 y.o. Caucasian male with medical history significant for autistic disorder, growth hormone deficiency and hypothyroidism, who presented to the emergency room with acute onset of generalized weakness with subsequent difficulty with ambulation.  He has falls with bruises in his lower extremity, mild elevation of CK level.  CT scan showed marked distended urinary bladder. Patient was placed on IV fluids, Foley catheter anchored for urinary retention. Patient was found to have B12 and Folate  deficiency, started on treatment.  Celiac disease panel sent out.  Patient also had elevated free T4, but normal TSH.  Synthroid dose decreased. Patient seen by neurology, MRI of the brain and spines were attempted, without success due to patient not able to lay still.  Etiology of leg weakness is still uncertain.  After discussion with [facility], patient be transferred to their hospital for further workup.

## 2022-07-23 NOTE — ED Notes (Signed)
Pt repositioned in bed.

## 2022-07-23 NOTE — Progress Notes (Signed)
   07/23/22 2200  Spiritual Encounters  Type of Visit Initial  Care provided to: Pt and family  Referral source Family  Reason for visit Urgent spiritual support  OnCall Visit Yes  Spiritual Framework  Presenting Themes Courage hope and growth  Interventions  Spiritual Care Interventions Made Compassionate presence;Established relationship of care and support;Narrative/life review  Intervention Outcomes  Outcomes Reduced anxiety;Reduced isolation   Chaplain visited with the patient and family following a request by the family. Azazel's Mom was feeling lonely and isolated, she expressed hope for her only sons health. Melven Sartorius gave Mom a big hug and assured her of the support by entire care team in [facility]. Family was calm and very grateful for the visit

## 2022-07-23 NOTE — Progress Notes (Signed)
  Progress Note   Patient: Richard Hebert DJS:[id number] DOB: Feb 24, 1996 DOA: 07/22/2022     1 DOS: the patient was seen and examined on 07/23/2022   Brief hospital course: FREDERIC TONES is a 27 y.o. Caucasian male with medical history significant for autistic disorder, growth hormone deficiency and hypothyroidism, who presented to the emergency room with acute onset of generalized weakness with subsequent difficulty with ambulation.  He has falls with bruises in his lower extremity, mild elevation of CK level.  CT scan showed marked distended urinary bladder. Patient was placed on IV fluids, Foley catheter anchored for urinary retention.  Assessment and Plan: * Rhabdomyolysis Has a mild elevation in CK, received IV fluids.  No need for additional fluids for now.  Hypotension Patient does not seem to have any evidence of infection.  Hold off antibiotics.  Blood pressure is better after fluids, discontinued for now. Check cortisol level to rule out adrenal insufficiency.  Bilateral leg weakness. Fall Severe urinary retention. Discussed with neurology, obtain MRI of the brain and spines.  Foley catheter in place.  Vitamin B12 deficient anemia. Started B12 injection.  Hypothyroidism Patient has elevated free T4, TSH at the lower end, will decrease dose of Synthroid.  Metabolic acidosis. Hypokalemia. CO2 level 16, started bicarb drip.  Anion gap normal.  Potassium has normalized.  Autism. Follow.  Sinus tachycardia. Patient has chronic sinus tachycardia per mother.       Subjective:  Patient has some confusion, general weakness.  Physical Exam: Vitals:   07/23/22 1100 07/23/22 1157 07/23/22 1230 07/23/22 1350  BP: 94/60 (!) 106/49 (!) 111/52 136/63  Pulse: (!) 119 (!) 120 (!) 120 (!) 122  Resp: (!) 25 20 19 20   Temp:  97.6 F (36.4 C)  (!) 97.3 F (36.3 C)  TempSrc:  Axillary  Axillary  SpO2: 98% 96% 98% 99%  Weight:      Height:       General exam: Appears  calm and comfortable  Respiratory system: Clear to auscultation. Respiratory effort normal. Cardiovascular system: S1 & S2 heard, RRR. No JVD, murmurs, rubs, gallops or clicks. No pedal edema. Gastrointestinal system: Abdomen is nondistended, soft and nontender. No organomegaly or masses felt. Normal bowel sounds heard. Central nervous system: Alert and oriented x2.  Bilateral leg weakness.. Extremities: Symmetric 5 x 5 power. Skin: No rashes, lesions or ulcers   Data Reviewed:  Reviewed the CT scan results, reviewed all lab results.  Family Communication: Mother updated at bedside.  Disposition: Status is: Inpatient Remains inpatient appropriate because: Severity of disease, IV treatment  Planned Discharge Destination: Home with Home Health    Time spent: no charge minutes  Author: Sharen Hones, MD 07/23/2022 2:31 PM  For on call review [email or web address].

## 2022-07-24 DIAGNOSIS — R531 Weakness: Secondary | ICD-10-CM | POA: Diagnosis not present

## 2022-07-24 DIAGNOSIS — R338 Other retention of urine: Secondary | ICD-10-CM

## 2022-07-24 DIAGNOSIS — R29898 Other symptoms and signs involving the musculoskeletal system: Secondary | ICD-10-CM

## 2022-07-24 DIAGNOSIS — E876 Hypokalemia: Secondary | ICD-10-CM | POA: Diagnosis not present

## 2022-07-24 DIAGNOSIS — D518 Other vitamin B12 deficiency anemias: Secondary | ICD-10-CM | POA: Diagnosis not present

## 2022-07-24 DIAGNOSIS — R21 Rash and other nonspecific skin eruption: Secondary | ICD-10-CM | POA: Diagnosis not present

## 2022-07-24 DIAGNOSIS — D529 Folate deficiency anemia, unspecified: Secondary | ICD-10-CM | POA: Insufficient documentation

## 2022-07-24 LAB — CBC
HCT: 28 % — ABNORMAL LOW (ref 39.0–52.0)
Hemoglobin: 9.1 g/dL — ABNORMAL LOW (ref 13.0–17.0)
MCH: 26.5 pg (ref 26.0–34.0)
MCHC: 32.5 g/dL (ref 30.0–36.0)
MCV: 81.4 fL (ref 80.0–100.0)
Platelets: 331 10*3/uL (ref 150–400)
RBC: 3.44 MIL/uL — ABNORMAL LOW (ref 4.22–5.81)
RDW: 16.8 % — ABNORMAL HIGH (ref 11.5–15.5)
WBC: 11.5 10*3/uL — ABNORMAL HIGH (ref 4.0–10.5)
nRBC: 0.2 % (ref 0.0–0.2)

## 2022-07-24 LAB — BASIC METABOLIC PANEL
Anion gap: 8 (ref 5–15)
BUN: 19 mg/dL (ref 6–20)
CO2: 22 mmol/L (ref 22–32)
Calcium: 8.5 mg/dL — ABNORMAL LOW (ref 8.9–10.3)
Chloride: 105 mmol/L (ref 98–111)
Creatinine, Ser: 0.86 mg/dL (ref 0.61–1.24)
GFR, Estimated: >60 mL/min (ref >60)
Glucose, Bld: 106 mg/dL — ABNORMAL HIGH (ref 70–99)
Potassium: 3.4 mmol/L — ABNORMAL LOW (ref 3.5–5.1)
Sodium: 135 mmol/L (ref 135–145)

## 2022-07-24 LAB — IRON AND TIBC
Iron: 93 ug/dL (ref 45–182)
Saturation Ratios: 42 % — ABNORMAL HIGH (ref 17.9–39.5)
TIBC: 224 ug/dL — ABNORMAL LOW (ref 250–450)
UIBC: 131 ug/dL

## 2022-07-24 LAB — ANTINUCLEAR ANTIBODIES, IFA: ANA Ab, IFA: NEGATIVE

## 2022-07-24 LAB — CK: Total CK: 515 U/L — ABNORMAL HIGH (ref 49–397)

## 2022-07-24 LAB — FERRITIN: Ferritin: 105 ng/mL (ref 24–336)

## 2022-07-24 LAB — MAGNESIUM: Magnesium: 2.5 mg/dL — ABNORMAL HIGH (ref 1.7–2.4)

## 2022-07-24 LAB — CORTISOL-AM, BLOOD: Cortisol - AM: 19.5 ug/dL (ref 6.7–22.6)

## 2022-07-24 LAB — PHOSPHORUS: Phosphorus: 3.4 mg/dL (ref 2.5–4.6)

## 2022-07-24 MED ORDER — FOLIC ACID 5 MG/ML IJ SOLN
1.0000 mg | Freq: Every day | INTRAMUSCULAR | Status: DC
  Administered 2022-07-24 – 2022-07-25 (×2): 1 mg via INTRAVENOUS
  Filled 2022-07-24 (×2): qty 0.2

## 2022-07-24 MED ORDER — LEVOTHYROXINE SODIUM 50 MCG PO TABS
75.0000 ug | ORAL_TABLET | Freq: Every day | ORAL | Status: DC
  Administered 2022-07-25 – 2022-07-26 (×2): 75 ug via ORAL
  Filled 2022-07-24 (×2): qty 2

## 2022-07-24 MED ORDER — METOPROLOL TARTRATE 25 MG PO TABS
25.0000 mg | ORAL_TABLET | Freq: Two times a day (BID) | ORAL | Status: DC
  Administered 2022-07-24 – 2022-07-26 (×4): 25 mg via ORAL
  Filled 2022-07-24 (×4): qty 1

## 2022-07-24 MED ORDER — POTASSIUM CHLORIDE CRYS ER 20 MEQ PO TBCR
40.0000 meq | EXTENDED_RELEASE_TABLET | ORAL | Status: AC
  Administered 2022-07-24 (×2): 40 meq via ORAL
  Filled 2022-07-24 (×2): qty 2

## 2022-07-24 MED ORDER — CHLORHEXIDINE GLUCONATE CLOTH 2 % EX PADS
6.0000 | MEDICATED_PAD | Freq: Every day | CUTANEOUS | Status: DC
  Administered 2022-07-24: 6 via TOPICAL

## 2022-07-24 NOTE — Evaluation (Signed)
Physical Therapy Evaluation Patient Details Name: Richard Hebert MRN: [id number] DOB: [birth year] Today's Date: 07/24/2022  History of Present Illness  Richard Hebert is a 27 y.o. Caucasian male with medical history significant for autistic disorder, growth hormone deficiency and hypothyroidism, who presented to the emergency room with acute onset of generalized weakness with subsequent difficulty with ambulation.  He fell twice per his mother.   Clinical Impression  Patient received in bed, he is alert. Mother, who is his caregiver is present at bedside. Patient requires mod A to get seated on edge of bed. Erratic with mobility. He is able to stand with +2 mod Assist after 2 tries. Able to stand briefly before sitting himself back down on bed. Patient found to be soiled in bed at this time therefore further mobility attempts deferred for now. Difficulty with following directions at times and decreased safety. He is not safe to attempt walking at this time due to erratic behavior during evaluation. Patient will require more assistance than his mother will be able to provide at this time and will benefit from continues skilled PT to improve functional independence.           Recommendations for follow up therapy are one component of a multi-disciplinary discharge planning process, led by the attending physician.  Recommendations may be updated based on patient status, additional functional criteria and insurance authorization.  Follow Up Recommendations Skilled nursing-short term rehab (<3 hours/day) Can patient physically be transported by private vehicle: No    Assistance Recommended at Discharge Frequent or constant Supervision/Assistance  Patient can return home with the following  A lot of help with walking and/or transfers;A lot of help with bathing/dressing/bathroom;Assistance with feeding;Direct supervision/assist for medications management;Help with stairs or ramp for entrance;Assist for  transportation;Assistance with cooking/housework    Equipment Recommendations Other (comment) (TBD)  Recommendations for Other Services       Functional Status Assessment Patient has had a recent decline in their functional status and demonstrates the ability to make significant improvements in function in a reasonable and predictable amount of time.     Precautions / Restrictions Precautions Precautions: Fall Restrictions Weight Bearing Restrictions: No      Mobility  Bed Mobility Overal bed mobility: Needs Assistance Bed Mobility: Supine to Sit     Supine to sit: Min assist          Transfers Overall transfer level: Needs assistance Equipment used: 2 person hand held assist Transfers: Sit to/from Stand Sit to Stand: +2 physical assistance, +2 safety/equipment           General transfer comment: stood briefly at edge of bed but erratic and sat himself back down impulsively. Patient also found to be soiled and bed soiled requiring clean up prior to further mobilization.    Ambulation/Gait               General Gait Details: unable to safely attempt ambulation  Stairs            Wheelchair Mobility    Modified Rankin (Stroke Patients Only)       Balance Overall balance assessment: Needs assistance Sitting-balance support: Feet supported Sitting balance-Leahy Scale: Fair     Standing balance support: Bilateral upper extremity supported, During functional activity Standing balance-Leahy Scale: Zero Standing balance comment: unable to stand without +2 assistance  Pertinent Vitals/Pain Pain Assessment Pain Assessment: No/denies pain    Home Living Family/patient expects to be discharged to:: Private residence Living Arrangements: Parent Available Help at Discharge: Family;Available 24 hours/day             Home Equipment: None      Prior Function Prior Level of Function : Needs  assist;History of Falls (last six months)             Mobility Comments: patient does not ususally use AD ADLs Comments: requires assist from mother for bathing, dressing     Hand Dominance        Extremity/Trunk Assessment   Upper Extremity Assessment Upper Extremity Assessment: Difficult to assess due to impaired cognition    Lower Extremity Assessment Lower Extremity Assessment: Difficult to assess due to impaired cognition    Cervical / Trunk Assessment Cervical / Trunk Assessment: Normal  Communication   Communication: Expressive difficulties  Cognition Arousal/Alertness: Awake/alert Behavior During Therapy: Impulsive, Restless Overall Cognitive Status: History of cognitive impairments - at baseline                                          General Comments      Exercises     Assessment/Plan    PT Assessment Patient needs continued PT services  PT Problem List Decreased coordination;Decreased balance;Decreased mobility;Decreased safety awareness;Decreased cognition       PT Treatment Interventions Therapeutic exercise;Gait training;Stair training;Functional mobility training;Therapeutic activities;Patient/family education;Balance training    PT Goals (Current goals can be found in the Care Plan section)  Acute Rehab PT Goals Patient Stated Goal: unable PT Goal Formulation: Patient unable to participate in goal setting Time For Goal Achievement: 08/06/22    Frequency Min 2X/week     Co-evaluation               AM-PAC PT "6 Clicks" Mobility  Outcome Measure Help needed turning from your back to your side while in a flat bed without using bedrails?: A Lot Help needed moving from lying on your back to sitting on the side of a flat bed without using bedrails?: A Lot Help needed moving to and from a bed to a chair (including a wheelchair)?: Total Help needed standing up from a chair using your arms (e.g., wheelchair or bedside  chair)?: A Lot Help needed to walk in hospital room?: Total Help needed climbing 3-5 steps with a railing? : Total 6 Click Score: 9    End of Session   Activity Tolerance: Other (comment) (limited by cognition)   Nurse Communication: Mobility status;Other (comment) (patient needs cleaning up) PT Visit Diagnosis: Unsteadiness on feet (R26.81);Other abnormalities of gait and mobility (R26.89);Difficulty in walking, not elsewhere classified (R26.2);History of falling (Z91.81)    Time: 5409-8119 PT Time Calculation (min) (ACUTE ONLY): 11 min   Charges:   PT Evaluation $PT Eval Moderate Complexity: 1 Mod          Nur Krasinski, PT, GCS 07/24/22,12:04 PM

## 2022-07-24 NOTE — Progress Notes (Signed)
Subjective: Despite two attempts with sedation, we were unable to get an MRI of his C-spine.   Exam: Vitals:   07/24/22 0520 07/24/22 0918  BP: (!) 111/52 122/62  Pulse: (!) 108 (!) 128  Resp: 17 18  Temp: 98.2 F (36.8 C) 98.9 F (37.2 C)  SpO2: 98% 96%   Gen: In bed, NAD Resp: non-labored breathing, no acute distress Abd: soft, nt  Neuro: MS: awake, alert, says "help me" but can't verbalize what is bothering him.  KX:FGHW,  Motor: he moves allextremities well, though continues to let his wrists fall forwardthough he can extend them when encouraged to. He has at least 4/5 strength throughout  Sensory:he responds to minor stimulation throughout.  EXH:BZJIRC.   Pertinent Labs: B12 > 7500 Am cortisol normal at 19.5 Ck 515  Impression: 27 year old male with a neuromuscular complaint that is difficult to pinpoint.  He initially had issues with his hands and was then stable for almost 2 weeks before developing difficulty with his gait.  It is unclear to me how much sensory problem he has due to his limited ability to provide any type of history.  Formal strength testing is also very difficult, he has at least 4/5 strength, but clearly holds his hands in an abnormal fashion which is new for him.  He also cannot feed himself which is new.  The elevated CKs are also puzzling, though his distribution of weakness is not typical for myopathy/myositis.  Given the onset in the hands, I did favor getting an MRI of his cervical spine but unfortunately two attempts with sedation have been failures and therefore I think in order to get it we would have to intubate him and sedate him.  If we were to do this, I would also pursue lumbar puncture as Guillain-Barr syndrome is also on the differential.  He does have a rash as well, and unclear how this is playing into this.  I do think that hyperthyroidism can cause this, but though his free T4 is elevated his TSH is normal, but I continue to be  concerned that this may be playing a role.  The major issue we have with treatment decisions at this point is given the poor history I am not sure if this is a myopathic or neuropathic process.  Also his lack of reflexes could be a red herring given his history of congenital hypothyroidism.  I am not familiar enough with this to know whether he would be expected to have absent reflexes at baseline.  This could be masking signs of central process therefore I have continued to recommend MRI.  I would favor consideration of transfer to an academic center with EMG capability as well as endocrinology consultants.  If this does represent Guillain-Barr, he does not appear to be a fast progresser and I would favor further support for the diagnosis prior to any type of treatment.  If transfer is not possible, then we will likely need to involve the critical care team to intubate him for MRI/LP.  Recommendations: 1) would favor transfer to a center that could do an EMG/nerve conduction study as well as provide endocrinology consultants 2) MRI C-spine, would get T-spine as well given difficulty in obtaining imaging. 3) consideration of lumbar puncture for cells, glucose, protein 4) reduction in levothyroxine per internal medicine 5) neurology will continue to follow  Roland Rack, MD [facility] [phone]  If 7pm- 7am, please page neurology on call as listed in [place].

## 2022-07-24 NOTE — Progress Notes (Addendum)
Progress Note   Patient: Richard Hebert YSA:[id number] DOB: Jan 08, 1996 DOA: 07/22/2022     2 DOS: the patient was seen and examined on 07/24/2022   Brief hospital course: Richard Hebert is a 27 y.o. Caucasian male with medical history significant for autistic disorder, growth hormone deficiency and hypothyroidism, who presented to the emergency room with acute onset of generalized weakness with subsequent difficulty with ambulation.  He has falls with bruises in his lower extremity, mild elevation of CK level.  CT scan showed marked distended urinary bladder. Patient was placed on IV fluids, Foley catheter anchored for urinary retention.  Assessment and Plan: Rhabdomyolysis Has a mild elevation in CK, received IV fluids.  No need for additional fluids for now.   Hypotension Patient does not seem to have any evidence of infection.  Hold off antibiotics.  Blood pressure is better after fluids, discontinued for now. Cortisol level 19.5, patient does not have adrenal insufficiency.   Bilateral leg weakness. Fall Severe urinary retention. Foley catheter in place.  Patient could not tolerate MRI scan.  Discussed with neurology, patient will be transferred to [facility] for further workup, patient cannot be transferred until Sunday. Etiology of patient condition is still unclear, but patient condition does not seem to be progressively worse.   Vitamin B12 deficient anemia. Folic acid deficiency. Patient received B12 injection, will start oral B12 at time of discharge.  Folic acid level was also very low, started on IV folate. Patient appeared to be having multiple vitamin deficiencies, will also check a thiamine, b6, vitamin k and vitamin d levels, also check zinc and copper level. Check celiac disease panel for multi-vitamin deficiency   Hypothyroidism Sinus tachycardia. Patient has elevated free T4, TSH at the lower end, decreased dose of Synthroid. Tachycardia could be from  elevated free T4, add metoprolol 25 mg twice a day.   Metabolic acidosis. Hypokalemia. Metabolic acidosis has improved, continue replete potassium.  Macular skin rash. Patient has some skin rash on bilateral lower extremities, no rash on the face or chest.  Skin rash appears to be macular, etiology still unclear.  One possibility is nutrition deficiency.  Autism. Follow.    Patient has chronic sinus tachycardia per mother.      Subjective:  Patient still has significant weakness, seen by PT/OT, recommended nursing home placement.  He was able to stand, not able to walk. condition does not seem to be worse.  Physical Exam: Vitals:   07/24/22 0207 07/24/22 0520 07/24/22 0918 07/24/22 1513  BP: (!) 127/58 (!) 111/52 122/62 118/65  Pulse: (!) 123 (!) 108 (!) 128 (!) 126  Resp: 18 17 18 18   Temp: 97.7 F (36.5 C) 98.2 F (36.8 C) 98.9 F (37.2 C) 98.2 F (36.8 C)  TempSrc: Oral Oral Oral Oral  SpO2: 96% 98% 96% 98%  Weight:      Height:       General exam: Appears calm and comfortable  Respiratory system: Clear to auscultation. Respiratory effort normal. Cardiovascular system: S1 & S2 heard, RRR. No JVD, murmurs, rubs, gallops or clicks. No pedal edema. Gastrointestinal system: Abdomen is nondistended, soft and nontender. No organomegaly or masses felt. Normal bowel sounds heard. Central nervous system: Alert and oriented x2. No focal neurological deficits. Extremities: Symmetric 5 x 5 power. Skin: Macular skin rash, bilateral lower extremities. Psychiatry: Judgement and insight appear normal. Mood & affect appropriate.   Data Reviewed:  Lab results reviewed.  Family Communication: Mother at bedside, updated.  Disposition: Status  is: Inpatient Remains inpatient appropriate because: Severity of her disease.  Planned Discharge Destination:  [facility]    Time spent: 50 minutes  Author: Sharen Hones, MD 07/24/2022 4:04 PM  For on call review  [email or web address].
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
## 2022-07-25 DIAGNOSIS — E872 Acidosis, unspecified: Secondary | ICD-10-CM | POA: Diagnosis not present

## 2022-07-25 DIAGNOSIS — R338 Other retention of urine: Secondary | ICD-10-CM | POA: Diagnosis not present

## 2022-07-25 DIAGNOSIS — D518 Other vitamin B12 deficiency anemias: Secondary | ICD-10-CM | POA: Diagnosis not present

## 2022-07-25 DIAGNOSIS — R531 Weakness: Secondary | ICD-10-CM | POA: Diagnosis not present

## 2022-07-25 DIAGNOSIS — R29898 Other symptoms and signs involving the musculoskeletal system: Secondary | ICD-10-CM | POA: Diagnosis not present

## 2022-07-25 LAB — BASIC METABOLIC PANEL
Anion gap: 9 (ref 5–15)
BUN: 15 mg/dL (ref 6–20)
CO2: 21 mmol/L — ABNORMAL LOW (ref 22–32)
Calcium: 8.9 mg/dL (ref 8.9–10.3)
Chloride: 108 mmol/L (ref 98–111)
Creatinine, Ser: 0.77 mg/dL (ref 0.61–1.24)
GFR, Estimated: >60 mL/min (ref >60)
Glucose, Bld: 112 mg/dL — ABNORMAL HIGH (ref 70–99)
Potassium: 4.1 mmol/L (ref 3.5–5.1)
Sodium: 138 mmol/L (ref 135–145)

## 2022-07-25 LAB — CK: Total CK: 857 U/L — ABNORMAL HIGH (ref 49–397)

## 2022-07-25 LAB — MAGNESIUM: Magnesium: 2.5 mg/dL — ABNORMAL HIGH (ref 1.7–2.4)

## 2022-07-25 MED ORDER — LACTATED RINGERS IV SOLN: INTRAVENOUS | Status: DC

## 2022-07-25 MED ORDER — FOLIC ACID 1 MG PO TABS
1.0000 mg | ORAL_TABLET | Freq: Every day | ORAL | Status: DC
  Administered 2022-07-26: 1 mg via ORAL
  Filled 2022-07-25: qty 1

## 2022-07-25 NOTE — Progress Notes (Signed)
Subjective: No significant cahnge  Exam: Vitals:   07/25/22 0542 07/25/22 1000  BP: 127/65 138/76  Pulse: (!) 113 (!) 110  Resp: 20   Temp: 97.9 F (36.6 C)   SpO2: 99% 100%   Gen: In bed, NAD Resp: non-labored breathing, no acute distress Abd: soft, nt  Neuro: MS: awake, alert, able to answer some simple questions and follows some simple commands.  UK:GURK,  Motor: he moves all extremities well, though continues to let his wrists fall forward though he can extend them when encouraged to. He has at least 4/5 strength throughout  Sensory:he responds to minor stimulation throughout.  YHC:WCBJSE.   Pertinent Labs: B12 174 two weeks ago, improved with repletion Folate 1.7 Ck 857  Impression: 27 year old male with a neuromuscular complaint that is difficult to pinpoint.  He initially had issues with his hands and was then stable for almost 2 weeks before developing difficulty with his gait.  It is unclear to me how much sensory problem he has due to his limited ability to provide any type of history.  Formal strength testing is also very difficult, he has at least 4/5 strength, but clearly holds his hands in an abnormal fashion which is new for him.  He also cannot feed himself which is new.  The elevated CKs are also puzzling, though his distribution of weakness is not typical for myopathy/myositis.   Given the onset in the hands, I did favor getting an MRI of his cervical spine but unfortunately two attempts with sedation have been failures and therefore I think in order to get it we would have to intubate him and sedate him.  If we were to do this, I would also pursue lumbar puncture as Guillain-Barr syndrome is also on the differential.  He does have a rash as well, and unclear how this is playing into this.  I do think that hyperthyroidism can cause this, but though his free T4 is elevated his TSH is normal, but I continue to be concerned that this may be playing a role.  The  major issue we have with treatment decisions at this point is given the poor history I am not sure if this is a myopathic or neuropathic process.  Also his lack of reflexes could be a red herring given his history of congenital hypothyroidism.  I am not familiar enough with this to know whether he would be expected to have absent reflexes at baseline.  This could be masking signs of central process therefore I have continued to recommend MRI.  I would favor consideration of transfer to an academic center with EMG capability as well as endocrinology consultants.  If this does represent Guillain-Barr, he does not appear to be a fast progresser and I would favor further support for the diagnosis prior to any type of treatment.  If transfer is not possible, then we will likely need to involve the critical care team to intubate him for MRI/LP.  Recommendations: 1) [facility] is accepting, anticipating transfer on Sunday 2) Would favor holding off on interventions that would require intubation(MRI, LP) pending transfer 3) reduction in levothyroxine per internal medicine 5) agree with nutritional labs and folate supplementation  6) neurology will continue to follow  Roland Rack, MD [facility] [phone]  If 7pm- 7am, please page neurology on call as listed in [place].

## 2022-07-25 NOTE — Progress Notes (Signed)
Progress Note   Patient: Richard Hebert HCW:[id number] DOB: [birth year] DOA: 07/22/2022     3 DOS: the patient was seen and examined on 07/25/2022   Brief hospital course: Richard Hebert is a 27 y.o. Caucasian male with medical history significant for autistic disorder, growth hormone deficiency and hypothyroidism, who presented to the emergency room with acute onset of generalized weakness with subsequent difficulty with ambulation.  He has falls with bruises in his lower extremity, mild elevation of CK level.  CT scan showed marked distended urinary bladder. Patient was placed on IV fluids, Foley catheter anchored for urinary retention. Patient was found to have B12 and Folate  deficiency, started on treatment.  Celiac disease panel sent out.  Patient also had elevated free T4, but normal TSH.  Synthroid dose decreased.  Assessment and Plan:  Rhabdomyolysis Metabolic acidosis. Hypokalemia. Patient still has mild metabolic acidosis, CK level higher today.  Restarted lactated Ringer solution.  Recheck levels tomorrow.   Hypotension Patient does not seem to have any evidence of infection.  Hold off antibiotics.  Blood pressure is better after fluids, discontinued for now. Cortisol level 19.5, patient does not have adrenal insufficiency. Condition has improved.  Bilateral leg weakness. Fall Severe urinary retention. Foley catheter in place.  Patient could not tolerate MRI scan.  Discussed with neurology, patient will be transferred to [facility] for further workup, patient cannot be transferred until Sunday. Etiology of patient condition is still unclear, but patient condition does not seem to be progressively worse.   Vitamin B12 deficient anemia. Folic acid deficiency. Patient received B12 injection, will start oral B12 at time of discharge.  Folic acid level was also very low, started on IV folate. Patient appeared to be having multiple vitamin deficiencies, will also check a  thiamine, b6, vitamin k and vitamin d levels, also check zinc and copper level. Check celiac disease panel for multi-vitamin deficiency Above test results still pending.  Patient does not have iron deficiency.  Hypothyroidism Sinus tachycardia. Synthroid dose decreased, also added lower dose metoprolol.  Heart rate is better today.     Macular skin rash. Patient has some skin rash on bilateral lower extremities, no rash on the face or chest.  Skin rash appears to be macular, etiology still unclear.  One possibility is nutrition deficiency.  Patient stable, better.   Autism. Follow.     Subjective:  Patient had 2 loose stools yesterday.  Good appetite.  No shortness of breath.  Physical Exam: Vitals:   07/24/22 2051 07/24/22 2236 07/25/22 0542 07/25/22 1000  BP: 120/60 120/70 127/65 138/76  Pulse: (!) 120 (!) 103 (!) 113 (!) 110  Resp:  16 20   Temp:  98.2 F (36.8 C) 97.9 F (36.6 C) 98.7 F (37.1 C)  TempSrc:  Oral  Oral  SpO2:  100% 99% 100%  Weight:      Height:       General exam: Appears calm and comfortable  Respiratory system: Clear to auscultation. Respiratory effort normal. Cardiovascular system: S1 & S2 heard, RRR. No JVD, murmurs, rubs, gallops or clicks. No pedal edema. Gastrointestinal system: Abdomen is nondistended, soft and nontender. No organomegaly or masses felt. Normal bowel sounds heard. Central nervous system: Alert and oriented x2. No focal neurological deficits. Extremities: Symmetric 5 x 5 power. Skin: Macular rash seems to be better. Psychiatry: Judgement and insight appear normal. Mood & affect appropriate.   Data Reviewed:  Lab results reviewed.  Family Communication: Mother updated bedside.  Disposition: Status is: Inpatient Remains inpatient appropriate because: Severity of disease.  Planned Discharge Destination:  Richard Hebert tomorrow    Time spent: 35 minutes  Author: Sharen Hones, MD 07/25/2022 12:11 PM  For on call  review [email or web address].

## 2022-07-25 NOTE — Plan of Care (Signed)
  Problem: Clinical Measurements: °Goal: Will remain free from infection °Outcome: Progressing °Goal: Diagnostic test results will improve °Outcome: Progressing °Goal: Respiratory complications will improve °Outcome: Progressing °  °

## 2022-07-25 NOTE — Plan of Care (Signed)
  Problem: Education: Goal: Knowledge of General Education information will improve Description: Including pain rating scale, medication(s)/side effects and non-pharmacologic comfort measures Outcome: Progressing   Problem: Health Behavior/Discharge Planning: Goal: Ability to manage health-related needs will improve Outcome: Progressing   Problem: Clinical Measurements: Goal: Ability to maintain clinical measurements within normal limits will improve Outcome: Progressing Goal: Will remain free from infection Outcome: Progressing Goal: Diagnostic test results will improve Outcome: Progressing Goal: Cardiovascular complication will be avoided Outcome: Progressing   Problem: Coping: Goal: Level of anxiety will decrease Outcome: Progressing   Problem: Pain Managment: Goal: General experience of comfort will improve Outcome: Progressing   Problem: Safety: Goal: Ability to remain free from injury will improve Outcome: Progressing   

## 2022-07-26 DIAGNOSIS — T796XXA Traumatic ischemia of muscle, initial encounter: Secondary | ICD-10-CM

## 2022-07-26 DIAGNOSIS — R000 Tachycardia, unspecified: Secondary | ICD-10-CM | POA: Diagnosis not present

## 2022-07-26 DIAGNOSIS — Z8249 Family history of ischemic heart disease and other diseases of the circulatory system: Secondary | ICD-10-CM | POA: Diagnosis not present

## 2022-07-26 DIAGNOSIS — K5989 Other specified functional intestinal disorders: Secondary | ICD-10-CM | POA: Diagnosis not present

## 2022-07-26 DIAGNOSIS — R195 Other fecal abnormalities: Secondary | ICD-10-CM | POA: Diagnosis not present

## 2022-07-26 DIAGNOSIS — E871 Hypo-osmolality and hyponatremia: Secondary | ICD-10-CM

## 2022-07-26 DIAGNOSIS — K5939 Other megacolon: Secondary | ICD-10-CM | POA: Diagnosis not present

## 2022-07-26 DIAGNOSIS — Z79899 Other long term (current) drug therapy: Secondary | ICD-10-CM | POA: Diagnosis not present

## 2022-07-26 DIAGNOSIS — R338 Other retention of urine: Secondary | ICD-10-CM | POA: Diagnosis not present

## 2022-07-26 DIAGNOSIS — E872 Acidosis, unspecified: Secondary | ICD-10-CM | POA: Diagnosis not present

## 2022-07-26 DIAGNOSIS — E031 Congenital hypothyroidism without goiter: Secondary | ICD-10-CM | POA: Diagnosis not present

## 2022-07-26 DIAGNOSIS — G5701 Lesion of sciatic nerve, right lower limb: Secondary | ICD-10-CM | POA: Diagnosis not present

## 2022-07-26 DIAGNOSIS — E876 Hypokalemia: Secondary | ICD-10-CM | POA: Diagnosis not present

## 2022-07-26 DIAGNOSIS — R29898 Other symptoms and signs involving the musculoskeletal system: Secondary | ICD-10-CM | POA: Diagnosis not present

## 2022-07-26 DIAGNOSIS — R69 Illness, unspecified: Secondary | ICD-10-CM | POA: Diagnosis not present

## 2022-07-26 DIAGNOSIS — D518 Other vitamin B12 deficiency anemias: Secondary | ICD-10-CM | POA: Diagnosis not present

## 2022-07-26 DIAGNOSIS — R21 Rash and other nonspecific skin eruption: Secondary | ICD-10-CM | POA: Diagnosis not present

## 2022-07-26 DIAGNOSIS — K6389 Other specified diseases of intestine: Secondary | ICD-10-CM | POA: Diagnosis not present

## 2022-07-26 DIAGNOSIS — R9431 Abnormal electrocardiogram [ECG] [EKG]: Secondary | ICD-10-CM | POA: Diagnosis not present

## 2022-07-26 DIAGNOSIS — K567 Ileus, unspecified: Secondary | ICD-10-CM | POA: Diagnosis not present

## 2022-07-26 DIAGNOSIS — G5702 Lesion of sciatic nerve, left lower limb: Secondary | ICD-10-CM | POA: Diagnosis not present

## 2022-07-26 DIAGNOSIS — G629 Polyneuropathy, unspecified: Secondary | ICD-10-CM | POA: Diagnosis not present

## 2022-07-26 DIAGNOSIS — R531 Weakness: Secondary | ICD-10-CM | POA: Diagnosis not present

## 2022-07-26 HISTORY — DX: Hypo-osmolality and hyponatremia: E87.1

## 2022-07-26 LAB — CBC
HCT: 29.2 % — ABNORMAL LOW (ref 39.0–52.0)
Hemoglobin: 9.4 g/dL — ABNORMAL LOW (ref 13.0–17.0)
MCH: 26.3 pg (ref 26.0–34.0)
MCHC: 32.2 g/dL (ref 30.0–36.0)
MCV: 81.6 fL (ref 80.0–100.0)
Platelets: 355 10*3/uL (ref 150–400)
RBC: 3.58 MIL/uL — ABNORMAL LOW (ref 4.22–5.81)
RDW: 16.7 % — ABNORMAL HIGH (ref 11.5–15.5)
WBC: 10.9 10*3/uL — ABNORMAL HIGH (ref 4.0–10.5)
nRBC: 0 % (ref 0.0–0.2)

## 2022-07-26 LAB — BASIC METABOLIC PANEL
Anion gap: 8 (ref 5–15)
BUN: 12 mg/dL (ref 6–20)
CO2: 21 mmol/L — ABNORMAL LOW (ref 22–32)
Calcium: 8.8 mg/dL — ABNORMAL LOW (ref 8.9–10.3)
Chloride: 105 mmol/L (ref 98–111)
Creatinine, Ser: 0.68 mg/dL (ref 0.61–1.24)
GFR, Estimated: >60 mL/min (ref >60)
Glucose, Bld: 102 mg/dL — ABNORMAL HIGH (ref 70–99)
Potassium: 4.1 mmol/L (ref 3.5–5.1)
Sodium: 134 mmol/L — ABNORMAL LOW (ref 135–145)

## 2022-07-26 LAB — CK: Total CK: 682 U/L — ABNORMAL HIGH (ref 49–397)

## 2022-07-26 LAB — MAGNESIUM: Magnesium: 2.2 mg/dL (ref 1.7–2.4)

## 2022-07-26 MED ORDER — HALOPERIDOL 2 MG PO TABS: 2.0000 mg | ORAL_TABLET | Freq: Four times a day (QID) | ORAL | Status: DC | PRN

## 2022-07-26 MED ORDER — FOLIC ACID 1 MG PO TABS: 1.0000 mg | ORAL_TABLET | Freq: Every day | ORAL | Status: DC

## 2022-07-26 MED ORDER — VITAMIN B-12 1000 MCG PO TABS: 1000.0000 ug | ORAL_TABLET | Freq: Every day | ORAL | Status: DC

## 2022-07-26 MED ORDER — TRAZODONE HCL 50 MG PO TABS: 25.0000 mg | ORAL_TABLET | Freq: Every evening | ORAL | Status: DC | PRN

## 2022-07-26 MED ORDER — METOPROLOL TARTRATE 25 MG PO TABS: 25.0000 mg | ORAL_TABLET | Freq: Two times a day (BID) | ORAL | Status: DC

## 2022-07-26 MED ORDER — LEVOTHYROXINE SODIUM 75 MCG PO TABS: 75.0000 ug | ORAL_TABLET | Freq: Every day | ORAL | Status: DC

## 2022-07-26 NOTE — Progress Notes (Signed)
Subjective: No significant cahnge  Exam: Vitals:   07/26/22 0416 07/26/22 0756  BP: 115/62 113/69  Pulse: (!) 110 (!) 115  Resp: 20 17  Temp: 98.7 F (37.1 C) 98.4 F (36.9 C)  SpO2: 97% 98%   Gen: In bed, NAD Resp: non-labored breathing, no acute distress Abd: soft, nt  Neuro: MS: awake, alert, able to answer some simple questions and follows some simple commands.  OZ:HYQM,  Motor: he moves all extremities well, though continues to let his wrists fall forward though he can extend them when encouraged to with 3/5 strenght. He has at least 4/5 strength throughout  Sensory:he responds to minor stimulation throughout.  VHQ:IONGEX.   Pertinent Labs: B12 174 two weeks ago, improved with repletion Folate 1.7 Ck 515->857->682  Impression: 27 year old male with a neuromuscular complaint that is difficult to pinpoint.  He initially had issues with his hands and was then stable for almost 2 weeks before developing difficulty with his gait.  It is unclear to me how much sensory problem he has due to his limited ability to provide any type of history.  Formal strength testing is also very difficult, he has at least 4/5 strength, but clearly holds his hands in an abnormal fashion which is new for him and conssitent with wrist extensor weakness.  He also cannot feed himself which is new.  The elevated CKs are also puzzling, though his distribution of weakness is not typical for myopathy/myositis.   Given the onset in the hands, I did favor getting an MRI of his cervical spine but unfortunately two attempts with sedation have been failures and therefore I think in order to get it we would have to intubate him and sedate him.  If we were to do this, I would also pursue lumbar puncture as Guillain-Barr syndrome is also on the differential.  He does have a rash as well, and unclear how this is playing into this.  I do think that hyperthyroidism can cause this, but though his free T4 is elevated  his TSH is normal, but I continue to be concerned that this may be playing a role.  The major issue we have with treatment decisions at this point is given the poor history I am not sure if this is a myopathic or neuropathic process.  Also his lack of reflexes could be a red herring given his history of congenital hypothyroidism.  I am not familiar enough with this to know whether he would be expected to have absent reflexes at baseline.  This could be masking signs of central process therefore I have continued to recommend MRI.  I would favor consideration of transfer to an academic center with EMG capability as well as endocrinology consultants.  If this does represent Guillain-Barr, he does not appear to be a fast progresser and I would favor further support for the diagnosis prior to any type of treatment.  If transfer is not possible, then we will likely need to involve the critical care team to intubate him for MRI/LP.  Recommendations: 1) [facility] is accepting, anticipating transfer on Sunday 2) Would favor holding off on interventions that would require intubation(MRI, LP) pending transfer 3) reduction in levothyroxine per internal medicine 5) Zinc, b1, b6, D, copper are pending 6) neurology will continue to follow  Roland Rack, MD [facility] [phone]  If 7pm- 7am, please page neurology on call as listed in [place].

## 2022-07-26 NOTE — Plan of Care (Signed)
Report called to Joelene Millin, [place] room 24.. IV remained in place and patient transported by [employer] transport

## 2022-07-26 NOTE — Discharge Summary (Signed)
Physician Discharge Summary   Patient: Richard Hebert MRN: [id number] DOB: December 23, 1995  Admit date:     07/22/2022  Discharge date: 07/26/22  Discharge Physician: Sharen Hones   PCP: Mardene Speak, PA-C   Recommendations at discharge:   Transfer to [facility].  Discharge Diagnoses: Principal Problem:   Traumatic rhabdomyolysis ([place]) Active Problems:   Autism   Hypokalemia   Hypothyroidism   Weakness   Hypotension   Metabolic acidosis   Reactive thrombocytosis   Vitamin B12 deficiency (dietary) anemia   Acute urinary retention   Leg weakness, bilateral   Folate deficiency anemia   Skin macule or macular rash   Hyponatremia  Resolved Problems:   * No resolved hospital problems. *  Hospital Course: Richard Hebert is a 27 y.o. Caucasian male with medical history significant for autistic disorder, growth hormone deficiency and hypothyroidism, who presented to the emergency room with acute onset of generalized weakness with subsequent difficulty with ambulation.  He has falls with bruises in his lower extremity, mild elevation of CK level.  CT scan showed marked distended urinary bladder. Patient was placed on IV fluids, Foley catheter anchored for urinary retention. Patient was found to have B12 and Folate  deficiency, started on treatment.  Celiac disease panel sent out.  Patient also had elevated free T4, but normal TSH.  Synthroid dose decreased. Patient seen by neurology, MRI of the brain and spines were attempted, without success due to patient not able to lay still.  Etiology of leg weakness is still uncertain.  After discussion with [facility], patient be transferred to their hospital for further workup.  Assessment and Plan: Rhabdomyolysis Metabolic acidosis. Hypokalemia. Patient appears to have recurrent lower level of CK level elevation.  Need to consider the possibility of dermatomyositis, this could be associated with celiac disease as we have suspected.   Celiac panel is pending.  Further workup will be considered by [facility].   Hypotension Patient does not seem to have any evidence of infection.  Hold off antibiotics.  Blood pressure is better after fluids, discontinued for now. Cortisol level 19.5, patient does not have adrenal insufficiency. Condition has improved.   Bilateral leg weakness. Fall Severe urinary retention. Foley catheter in place.  Patient could not tolerate MRI scan.  Discussed with neurology, patient will be transferred to [facility] for further workup, patient cannot be transferred until Sunday. Etiology of patient condition is still unclear, but patient condition does not seem to be progressively worse. Patient may need complete MRI studies and/or LP.   Vitamin B12 deficient anemia. Folic acid deficiency. Patient received B12 injection, will start oral B12 at time of discharge.  Folic acid level was also very low, started on IV folate. Patient appeared to be having multiple vitamin deficiencies, will also check a thiamine, b6, vitamin k and vitamin d levels, also check zinc and copper level. Check celiac disease panel for multi-vitamin deficiency Above test results still pending.  Patient does not have iron deficiency.   Hypothyroidism Sinus tachycardia. Patient has elevated free T4, TSH level is at the lower end.  Synthroid dose decreased, also added lower dose metoprolol.  Heart rate is better.     Macular skin rash. Patient has some skin rash on bilateral lower extremities, no rash on the face or chest.  Skin rash appears to be macular, etiology still unclear.  One possibility is nutrition deficiency versus celiac disease.  Patient stable, better.   Autism. Follow.  Consultants: Neurology Procedures performed: None  Disposition:  [facility] Diet recommendation:  Discharge Diet Orders (From admission, onward)     Start     Ordered   07/26/22 0000  Diet - low sodium heart healthy         07/26/22 1232           Cardiac diet DISCHARGE MEDICATION: Allergies as of 07/26/2022   No Known Allergies      Medication List     STOP taking these medications    potassium chloride SA 20 MEQ tablet Commonly known as: KLOR-CON M       TAKE these medications    cyanocobalamin 1000 MCG tablet Commonly known as: VITAMIN B12 Take 1 tablet (1,000 mcg total) by mouth daily.   folic acid 1 MG tablet Commonly known as: FOLVITE Take 1 tablet (1 mg total) by mouth daily. Start taking on: July 27, 2022   haloperidol 2 MG tablet Commonly known as: HALDOL Take 1 tablet (2 mg total) by mouth every 6 (six) hours as needed for agitation.   levothyroxine 75 MCG tablet Commonly known as: SYNTHROID Take 1 tablet (75 mcg total) by mouth daily at 6 (six) AM. Start taking on: July 27, 2022 What changed:  medication strength how much to take when to take this   metoprolol tartrate 25 MG tablet Commonly known as: LOPRESSOR Take 1 tablet (25 mg total) by mouth 2 (two) times daily.   traZODone 50 MG tablet Commonly known as: DESYREL Take 0.5 tablets (25 mg total) by mouth at bedtime as needed for sleep.        Discharge Exam: Filed Weights   07/22/22 1431  Weight: 69.3 kg   General exam: Appears calm and comfortable  Respiratory system: Clear to auscultation. Respiratory effort normal. Cardiovascular system: S1 & S2 heard, RRR. No JVD, murmurs, rubs, gallops or clicks. No pedal edema. Gastrointestinal system: Abdomen is nondistended, soft and nontender. No organomegaly or masses felt. Normal bowel sounds heard. Central nervous system: Alert and oriented x2. No focal neurological deficits. Extremities: Significant leg weakness. Skin: No rashes, lesions or ulcers Psychiatry: Mood & affect appropriate.    Condition at discharge: fair  The results of significant diagnostics from this hospitalization (including imaging, microbiology, ancillary and  laboratory) are listed below for reference.   Imaging Studies: MR CERVICAL SPINE WO CONTRAST  Result Date: 07/23/2022 CLINICAL DATA:  27 year old male with history of acute myelopathy. Examination has been performed as a second/repeat attempt from exam performed earlier today, which was markedly degraded by motion artifact. EXAM: MRI CERVICAL SPINE WITHOUT CONTRAST TECHNIQUE: Multiplanar, multisequence MR imaging of the cervical spine was performed. No intravenous contrast was administered. COMPARISON:  Comparison made with prior MRI from earlier the same day. FINDINGS: Alignment: Unfortunately, the current examination is severely degraded by motion artifact and is nearly nondiagnostic, similar to study performed earlier the same day. Moderate dextroscoliosis. Straightening with mild reversal of the normal cervical lordosis. No visible listhesis. Vertebrae: Vertebral body height grossly maintained with no visible acute or chronic fracture. Bone marrow signal intensity grossly within normal limits. No visible discrete or worrisome osseous lesions. No abnormal marrow edema. Cord: Grossly normal signal and morphology, although evaluation is fairly limited by motion. Posterior Fossa, vertebral arteries, paraspinal tissues: Visualized brain and posterior fossa of grossly within normal limits. Craniocervical junction normal. Paraspinous soft tissues demonstrate no visible abnormality. Grossly preserved flow voids within the vertebral arteries bilaterally. Disc levels: No visible significant disc pathology within  the cervical spine on this motion degraded exam. No significant spinal stenosis. Foramina appear grossly patent. IMPRESSION: 1. Severely motion degraded and nearly nondiagnostic exam, similar to study performed earlier the same day. 2. Grossly normal MRI appearance of the cervical spinal cord. No visible significant disc pathology or stenosis. Electronically Signed   By: Jeannine Boga M.D.   On:  07/23/2022 20:18   MR BRAIN WO CONTRAST  Result Date: 07/23/2022 CLINICAL DATA:  Neuro deficit, acute, stroke suspected. Myelopathy, acute, cervical spine.  Rule out compression. EXAM: MRI HEAD WITHOUT CONTRAST MRI CERVICAL SPINE WITHOUT CONTRAST TECHNIQUE: Multiplanar, multiecho pulse sequences of the brain and surrounding structures, and cervical spine, to include the craniocervical junction and cervicothoracic junction, were obtained without intravenous contrast. COMPARISON:  Head CT 07/22/2022. FINDINGS: MRI HEAD FINDINGS Motion degraded study. Brain: No acute infarct or hemorrhage. No mass or midline shift. No hydrocephalus or extra-axial collection. Basilar cisterns are patent Vascular: Normal flow voids. Skull and upper cervical spine: Within limits of the motion artifact, normal marrow signal. Sinuses/Orbits: Within limits of motion artifact, unremarkable. Other: None. MRI CERVICAL SPINE FINDINGS Nondiagnostic study secondary to severe patient motion artifact. IMPRESSION: MRI HEAD: Motion degraded study. Within this limitation, no acute intracranial abnormality or mass. MRI CERVICAL SPINE: Nondiagnostic study secondary to severe patient motion artifact. Electronically Signed   By: Emmit Alexanders M.D.   On: 07/23/2022 15:03   MR CERVICAL SPINE WO CONTRAST  Result Date: 07/23/2022 CLINICAL DATA:  Neuro deficit, acute, stroke suspected. Myelopathy, acute, cervical spine.  Rule out compression. EXAM: MRI HEAD WITHOUT CONTRAST MRI CERVICAL SPINE WITHOUT CONTRAST TECHNIQUE: Multiplanar, multiecho pulse sequences of the brain and surrounding structures, and cervical spine, to include the craniocervical junction and cervicothoracic junction, were obtained without intravenous contrast. COMPARISON:  Head CT 07/22/2022. FINDINGS: MRI HEAD FINDINGS Motion degraded study. Brain: No acute infarct or hemorrhage. No mass or midline shift. No hydrocephalus or extra-axial collection. Basilar cisterns are patent  Vascular: Normal flow voids. Skull and upper cervical spine: Within limits of the motion artifact, normal marrow signal. Sinuses/Orbits: Within limits of motion artifact, unremarkable. Other: None. MRI CERVICAL SPINE FINDINGS Nondiagnostic study secondary to severe patient motion artifact. IMPRESSION: MRI HEAD: Motion degraded study. Within this limitation, no acute intracranial abnormality or mass. MRI CERVICAL SPINE: Nondiagnostic study secondary to severe patient motion artifact. Electronically Signed   By: Emmit Alexanders M.D.   On: 07/23/2022 15:03   CT ABDOMEN PELVIS W CONTRAST  Result Date: 07/22/2022 CLINICAL DATA:  Abdomen pain EXAM: CT ABDOMEN AND PELVIS WITH CONTRAST TECHNIQUE: Multidetector CT imaging of the abdomen and pelvis was performed using the standard protocol following bolus administration of intravenous contrast. RADIATION DOSE REDUCTION: This exam was performed according to the departmental dose-optimization program which includes automated exposure control, adjustment of the mA and/or kV according to patient size and/or use of iterative reconstruction technique. CONTRAST:  168mL OMNIPAQUE IOHEXOL 300 MG/ML  SOLN COMPARISON:  None Available. FINDINGS: Lower chest: No acute abnormality. Hepatobiliary: No focal liver abnormality is seen. No gallstones, gallbladder wall thickening, or biliary dilatation. Pancreas: Unremarkable. No pancreatic ductal dilatation or surrounding inflammatory changes. Spleen: Normal in size without focal abnormality. Adrenals/Urinary Tract: Adrenal glands are unremarkable. Kidneys are normal, without renal calculi, focal lesion, or hydronephrosis. Bladder is markedly distended to the level of the umbilicus. Stomach/Bowel: The stomach is nonenlarged. Fluid-filled nondistended small bowel. Mild air distension of sigmoid and transverse colon with moderate stool. No acute bowel wall thickening. Negative appendix. Vascular/Lymphatic: No significant  vascular findings are  present. No enlarged abdominal or pelvic lymph nodes. Reproductive: Prostate is unremarkable. Other: No abdominal wall hernia or abnormality. No abdominopelvic ascites. Musculoskeletal: No acute or significant osseous findings. IMPRESSION: 1. No CT evidence for acute intra-abdominal or pelvic abnormality. 2. Marked distention of the urinary bladder to the level of the umbilicus. Electronically Signed   By: Donavan Foil M.D.   On: 07/22/2022 21:26   DG Chest Portable 1 View  Result Date: 07/22/2022 CLINICAL DATA:  Weakness EXAM: PORTABLE CHEST 1 VIEW COMPARISON:  Chest x-ray 06/29/2022 FINDINGS: The heart size and mediastinal contours are within normal limits. Both lungs are clear. The visualized skeletal structures are unremarkable. IMPRESSION: No active disease. Electronically Signed   By: Ronney Asters M.D.   On: 07/22/2022 19:20   CT HEAD WO CONTRAST (5MM)  Result Date: 07/22/2022 CLINICAL DATA:  Full body weakness EXAM: CT HEAD WITHOUT CONTRAST TECHNIQUE: Contiguous axial images were obtained from the base of the skull through the vertex without intravenous contrast. RADIATION DOSE REDUCTION: This exam was performed according to the departmental dose-optimization program which includes automated exposure control, adjustment of the mA and/or kV according to patient size and/or use of iterative reconstruction technique. COMPARISON:  None Available. FINDINGS: Brain: No evidence of acute infarction, hemorrhage, hydrocephalus, extra-axial collection or mass lesion/mass effect. Vascular: Asymmetrically hyperdense appearance of the left sigmoid/transverse sinus junction may be artifactual, but could also be seen with dural venous sinus thrombosis. Recommend further evaluation with a CT venogram. Skull: Normal. Negative for fracture or focal lesion. Sinuses/Orbits: No middle ear or mastoid effusion. Paranasal sinuses are clear. Orbits are unremarkable. Other: None. IMPRESSION: Asymmetrically hyperdense  appearance of the left sigmoid/transverse sinus junction may be artifactual, but could also be seen with dural venous sinus thrombosis. Recommend further evaluation with a CT venogram. Electronically Signed   By: Marin Roberts M.D.   On: 07/22/2022 19:02   DG Chest 1 View  Result Date: 07/09/2022 CLINICAL DATA:  Weakness and numbness. EXAM: CHEST  1 VIEW COMPARISON:  None Available. FINDINGS: The heart size and mediastinal contours are within normal limits. Low lung volumes are noted with subsequent crowding of the bibasilar bronchovascular lung markings. There is no evidence of an acute infiltrate, pleural effusion or pneumothorax. The visualized skeletal structures are unremarkable. IMPRESSION: Low lung volumes without evidence of acute or active cardiopulmonary disease. Electronically Signed   By: Virgina Norfolk M.D.   On: 07/09/2022 16:50    Microbiology: Results for orders placed or performed during the hospital encounter of 07/22/22  Blood culture (routine x 2)     Status: None (Preliminary result)   Collection Time: 07/22/22  3:53 PM   Specimen: BLOOD  Result Value Ref Range Status   Specimen Description BLOOD BLOOD RIGHT HAND  Final   Special Requests   Final    BOTTLES DRAWN AEROBIC AND ANAEROBIC Blood Culture adequate volume   Culture   Final    NO GROWTH 3 DAYS Performed at [facility], [address], [place], [place] [address]    Report Status PENDING  Incomplete  Blood culture (routine x 2)     Status: None (Preliminary result)   Collection Time: 07/22/22  5:37 PM   Specimen: BLOOD  Result Value Ref Range Status   Specimen Description BLOOD BLOOD LEFT ARM  Final   Special Requests   Final    BOTTLES DRAWN AEROBIC AND ANAEROBIC Blood Culture adequate volume   Culture   Final  NO GROWTH 4 DAYS Performed at [facility], [address] [place], [place] [address]    Report Status PENDING  Incomplete  Resp panel by RT-PCR (RSV, Flu A&B, Covid)  Anterior Nasal Swab     Status: None   Collection Time: 07/22/22  5:37 PM   Specimen: Anterior Nasal Swab  Result Value Ref Range Status   SARS Coronavirus 2 by RT PCR NEGATIVE NEGATIVE Final    Comment: (NOTE) SARS-CoV-2 target nucleic acids are NOT DETECTED.  The SARS-CoV-2 RNA is generally detectable in upper respiratory specimens during the acute phase of infection. The lowest concentration of SARS-CoV-2 viral copies this assay can detect is 138 copies/mL. A negative result does not preclude SARS-Cov-2 infection and should not be used as the sole basis for treatment or other patient management decisions. A negative result may occur with  improper specimen collection/handling, submission of specimen other than nasopharyngeal swab, presence of viral mutation(s) within the areas targeted by this assay, and inadequate number of viral copies(<138 copies/mL). A negative result must be combined with clinical observations, patient history, and epidemiological information. The expected result is Negative.  Fact Sheet for Patients:  EntrepreneurPulse.com.au  Fact Sheet for Healthcare Providers:  IncredibleEmployment.be  This test is no t yet approved or cleared by the [place] FDA and  has been authorized for detection and/or diagnosis of SARS-CoV-2 by FDA under an Emergency Use Authorization (EUA). This EUA will remain  in effect (meaning this test can be used) for the duration of the COVID-19 declaration under Section 564(b)(1) of the Act, 21 U.S.C.section 360bbb-3(b)(1), unless the authorization is terminated  or revoked sooner.       Influenza A by PCR NEGATIVE NEGATIVE Final   Influenza B by PCR NEGATIVE NEGATIVE Final    Comment: (NOTE) The Xpert Xpress SARS-CoV-2/FLU/RSV plus assay is intended as an aid in the diagnosis of influenza from Nasopharyngeal swab specimens and should not be used as a sole basis for treatment. Nasal washings  and aspirates are unacceptable for Xpert Xpress SARS-CoV-2/FLU/RSV testing.  Fact Sheet for Patients: EntrepreneurPulse.com.au  Fact Sheet for Healthcare Providers: IncredibleEmployment.be  This test is not yet approved or cleared by the [place] FDA and has been authorized for detection and/or diagnosis of SARS-CoV-2 by FDA under an Emergency Use Authorization (EUA). This EUA will remain in effect (meaning this test can be used) for the duration of the COVID-19 declaration under Section 564(b)(1) of the Act, 21 U.S.C. section 360bbb-3(b)(1), unless the authorization is terminated or revoked.     Resp Syncytial Virus by PCR NEGATIVE NEGATIVE Final    Comment: (NOTE) Fact Sheet for Patients: EntrepreneurPulse.com.au  Fact Sheet for Healthcare Providers: IncredibleEmployment.be  This test is not yet approved or cleared by the [place] FDA and has been authorized for detection and/or diagnosis of SARS-CoV-2 by FDA under an Emergency Use Authorization (EUA). This EUA will remain in effect (meaning this test can be used) for the duration of the COVID-19 declaration under Section 564(b)(1) of the Act, 21 U.S.C. section 360bbb-3(b)(1), unless the authorization is terminated or revoked.  Performed at [facility], [address] [place], [place] [address]     Labs: CBC: Recent Labs  Lab 07/22/22 1433 07/23/22 0455 07/24/22 0431 07/26/22 0402  WBC 15.0* 19.8* 11.5* 10.9*  NEUTROABS 12.4*  --   --   --   HGB 10.9* 9.5* 9.1* 9.4*  HCT 33.1* 29.5* 28.0* 29.2*  MCV 79.4* 81.7 81.4 81.6  PLT 466*  378 331 355   Basic Metabolic Panel: Recent Labs  Lab 07/22/22 1433 07/23/22 0455 07/24/22 0431 07/25/22 0418 07/26/22 0402  NA 133* 135 135 138 134*  K 2.9* 4.1 3.4* 4.1 4.1  CL 97* 105 105 108 105  CO2 21* 16* 22 21* 21*  GLUCOSE 114* 88 106* 112* 102*  BUN 19 24* 19 15 12    CREATININE 1.04 0.98 0.86 0.77 0.68  CALCIUM 9.3 8.6* 8.5* 8.9 8.8*  MG 2.7*  --  2.5* 2.5* 2.2  PHOS  --   --  3.4  --   --    Liver Function Tests: Recent Labs  Lab 07/22/22 1433  AST 47*  ALT 42  ALKPHOS 66  BILITOT 2.1*  PROT 7.6  ALBUMIN 4.3   CBG: No results for input(s): "GLUCAP" in the last 168 hours.  Discharge time spent: greater than 30 minutes.  Signed: 07/24/22, MD [facility] 07/26/2022

## 2022-07-27 LAB — CULTURE, BLOOD (ROUTINE X 2)
Culture: NO GROWTH
Special Requests: ADEQUATE

## 2022-07-27 LAB — MISC LABCORP TEST (SEND OUT): Labcorp test code: 81950

## 2022-07-28 LAB — MISC LABCORP TEST (SEND OUT): Labcorp test code: 81950

## 2022-07-28 LAB — CULTURE, BLOOD (ROUTINE X 2)
Culture: NO GROWTH
Special Requests: ADEQUATE

## 2022-07-29 LAB — VITAMIN K1, SERUM: VITAMIN K1: <0.1 ng/mL — ABNORMAL LOW (ref 0.10–2.20)

## 2022-07-29 LAB — VITAMIN B1: Vitamin B1 (Thiamine): 47.5 nmol/L — ABNORMAL LOW (ref 66.5–200.0)

## 2022-07-30 LAB — ZINC: Zinc: 69 ug/dL (ref 44–115)

## 2022-07-30 LAB — COPPER, SERUM: Copper: 85 ug/dL (ref 63–121)

## 2022-07-31 LAB — VITAMIN B1: Vitamin B1 (Thiamine): 37.3 nmol/L — ABNORMAL LOW (ref 66.5–200.0)

## 2022-08-04 LAB — VITAMIN B6: Vitamin B6: 6.5 ug/L (ref 3.4–65.2)

## 2022-08-06 DIAGNOSIS — K592 Neurogenic bowel, not elsewhere classified: Secondary | ICD-10-CM | POA: Diagnosis not present

## 2022-08-06 DIAGNOSIS — R531 Weakness: Secondary | ICD-10-CM | POA: Diagnosis not present

## 2022-08-06 DIAGNOSIS — R000 Tachycardia, unspecified: Secondary | ICD-10-CM | POA: Diagnosis not present

## 2022-08-06 DIAGNOSIS — N319 Neuromuscular dysfunction of bladder, unspecified: Secondary | ICD-10-CM | POA: Diagnosis not present

## 2022-08-06 DIAGNOSIS — E031 Congenital hypothyroidism without goiter: Secondary | ICD-10-CM | POA: Diagnosis not present

## 2022-08-06 DIAGNOSIS — R339 Retention of urine, unspecified: Secondary | ICD-10-CM | POA: Diagnosis not present

## 2022-08-06 DIAGNOSIS — G608 Other hereditary and idiopathic neuropathies: Secondary | ICD-10-CM | POA: Diagnosis not present

## 2022-08-06 DIAGNOSIS — G629 Polyneuropathy, unspecified: Secondary | ICD-10-CM | POA: Insufficient documentation

## 2022-08-06 DIAGNOSIS — E569 Vitamin deficiency, unspecified: Secondary | ICD-10-CM | POA: Diagnosis not present

## 2022-08-06 DIAGNOSIS — K560 Paralytic ileus: Secondary | ICD-10-CM | POA: Diagnosis not present

## 2022-08-06 DIAGNOSIS — G4700 Insomnia, unspecified: Secondary | ICD-10-CM | POA: Diagnosis not present

## 2022-08-06 DIAGNOSIS — Z7401 Bed confinement status: Secondary | ICD-10-CM | POA: Diagnosis not present

## 2022-08-06 DIAGNOSIS — K567 Ileus, unspecified: Secondary | ICD-10-CM | POA: Diagnosis not present

## 2022-08-06 DIAGNOSIS — R140 Abdominal distension (gaseous): Secondary | ICD-10-CM | POA: Diagnosis not present

## 2022-08-06 DIAGNOSIS — E876 Hypokalemia: Secondary | ICD-10-CM | POA: Diagnosis not present

## 2022-08-06 DIAGNOSIS — I4581 Long QT syndrome: Secondary | ICD-10-CM | POA: Diagnosis not present

## 2022-08-06 DIAGNOSIS — L299 Pruritus, unspecified: Secondary | ICD-10-CM | POA: Diagnosis not present

## 2022-08-06 DIAGNOSIS — R69 Illness, unspecified: Secondary | ICD-10-CM | POA: Diagnosis not present

## 2022-08-06 DIAGNOSIS — F840 Autistic disorder: Secondary | ICD-10-CM | POA: Diagnosis not present

## 2022-08-06 DIAGNOSIS — K6389 Other specified diseases of intestine: Secondary | ICD-10-CM | POA: Diagnosis not present

## 2022-08-06 DIAGNOSIS — R21 Rash and other nonspecific skin eruption: Secondary | ICD-10-CM | POA: Diagnosis not present

## 2022-08-06 DIAGNOSIS — R5381 Other malaise: Secondary | ICD-10-CM | POA: Diagnosis not present

## 2022-08-07 DIAGNOSIS — R5381 Other malaise: Secondary | ICD-10-CM | POA: Diagnosis not present

## 2022-08-08 DIAGNOSIS — R5381 Other malaise: Secondary | ICD-10-CM | POA: Diagnosis not present

## 2022-08-09 DIAGNOSIS — R5381 Other malaise: Secondary | ICD-10-CM | POA: Diagnosis not present

## 2022-08-10 DIAGNOSIS — R5381 Other malaise: Secondary | ICD-10-CM | POA: Diagnosis not present

## 2022-08-11 DIAGNOSIS — R5381 Other malaise: Secondary | ICD-10-CM | POA: Diagnosis not present

## 2022-08-12 DIAGNOSIS — R5381 Other malaise: Secondary | ICD-10-CM | POA: Diagnosis not present

## 2022-08-13 DIAGNOSIS — R5381 Other malaise: Secondary | ICD-10-CM | POA: Diagnosis not present

## 2022-08-13 DIAGNOSIS — K567 Ileus, unspecified: Secondary | ICD-10-CM | POA: Diagnosis not present

## 2022-08-13 DIAGNOSIS — K6389 Other specified diseases of intestine: Secondary | ICD-10-CM | POA: Diagnosis not present

## 2022-08-14 DIAGNOSIS — K567 Ileus, unspecified: Secondary | ICD-10-CM | POA: Diagnosis not present

## 2022-08-14 DIAGNOSIS — R5381 Other malaise: Secondary | ICD-10-CM | POA: Diagnosis not present

## 2022-08-14 DIAGNOSIS — K6389 Other specified diseases of intestine: Secondary | ICD-10-CM | POA: Diagnosis not present

## 2022-08-15 DIAGNOSIS — R5381 Other malaise: Secondary | ICD-10-CM | POA: Diagnosis not present

## 2022-08-16 DIAGNOSIS — R5381 Other malaise: Secondary | ICD-10-CM | POA: Diagnosis not present

## 2022-08-17 DIAGNOSIS — R5381 Other malaise: Secondary | ICD-10-CM | POA: Diagnosis not present

## 2022-08-18 DIAGNOSIS — R5381 Other malaise: Secondary | ICD-10-CM | POA: Diagnosis not present

## 2022-08-19 DIAGNOSIS — R5381 Other malaise: Secondary | ICD-10-CM | POA: Diagnosis not present

## 2022-08-20 DIAGNOSIS — R5381 Other malaise: Secondary | ICD-10-CM | POA: Diagnosis not present

## 2022-08-21 DIAGNOSIS — R5381 Other malaise: Secondary | ICD-10-CM | POA: Diagnosis not present

## 2022-08-22 DIAGNOSIS — R5381 Other malaise: Secondary | ICD-10-CM | POA: Diagnosis not present

## 2022-08-23 DIAGNOSIS — R5381 Other malaise: Secondary | ICD-10-CM | POA: Diagnosis not present

## 2022-08-24 DIAGNOSIS — R5381 Other malaise: Secondary | ICD-10-CM | POA: Diagnosis not present

## 2022-08-25 DIAGNOSIS — R5381 Other malaise: Secondary | ICD-10-CM | POA: Diagnosis not present

## 2022-08-26 DIAGNOSIS — R5381 Other malaise: Secondary | ICD-10-CM | POA: Diagnosis not present

## 2022-08-27 DIAGNOSIS — R5381 Other malaise: Secondary | ICD-10-CM | POA: Diagnosis not present

## 2022-08-28 DIAGNOSIS — R5381 Other malaise: Secondary | ICD-10-CM | POA: Diagnosis not present

## 2022-08-29 DIAGNOSIS — R5381 Other malaise: Secondary | ICD-10-CM | POA: Diagnosis not present

## 2022-08-30 DIAGNOSIS — R5381 Other malaise: Secondary | ICD-10-CM | POA: Diagnosis not present

## 2022-08-31 DIAGNOSIS — R5381 Other malaise: Secondary | ICD-10-CM | POA: Diagnosis not present

## 2022-09-01 DIAGNOSIS — R5381 Other malaise: Secondary | ICD-10-CM | POA: Diagnosis not present

## 2022-09-02 DIAGNOSIS — R5381 Other malaise: Secondary | ICD-10-CM | POA: Diagnosis not present

## 2022-09-03 DIAGNOSIS — R5381 Other malaise: Secondary | ICD-10-CM | POA: Diagnosis not present

## 2022-09-04 DIAGNOSIS — R5381 Other malaise: Secondary | ICD-10-CM | POA: Diagnosis not present

## 2022-09-04 DIAGNOSIS — G629 Polyneuropathy, unspecified: Secondary | ICD-10-CM | POA: Diagnosis not present

## 2022-09-05 DIAGNOSIS — R5381 Other malaise: Secondary | ICD-10-CM | POA: Diagnosis not present

## 2022-09-06 DIAGNOSIS — R5381 Other malaise: Secondary | ICD-10-CM | POA: Diagnosis not present

## 2022-09-07 DIAGNOSIS — R5381 Other malaise: Secondary | ICD-10-CM | POA: Diagnosis not present

## 2022-09-08 DIAGNOSIS — R5381 Other malaise: Secondary | ICD-10-CM | POA: Diagnosis not present

## 2022-09-09 DIAGNOSIS — R5381 Other malaise: Secondary | ICD-10-CM | POA: Diagnosis not present

## 2022-09-10 DIAGNOSIS — R5381 Other malaise: Secondary | ICD-10-CM | POA: Diagnosis not present

## 2022-09-11 DIAGNOSIS — R5381 Other malaise: Secondary | ICD-10-CM | POA: Diagnosis not present

## 2022-09-12 DIAGNOSIS — R5381 Other malaise: Secondary | ICD-10-CM | POA: Diagnosis not present

## 2022-09-13 DIAGNOSIS — R5381 Other malaise: Secondary | ICD-10-CM | POA: Diagnosis not present

## 2022-09-14 DIAGNOSIS — R5381 Other malaise: Secondary | ICD-10-CM | POA: Diagnosis not present

## 2022-09-15 DIAGNOSIS — R5381 Other malaise: Secondary | ICD-10-CM | POA: Diagnosis not present

## 2022-09-16 DIAGNOSIS — R5381 Other malaise: Secondary | ICD-10-CM | POA: Diagnosis not present

## 2022-09-17 DIAGNOSIS — R5381 Other malaise: Secondary | ICD-10-CM | POA: Diagnosis not present

## 2022-09-18 DIAGNOSIS — R5381 Other malaise: Secondary | ICD-10-CM | POA: Diagnosis not present

## 2022-09-19 DIAGNOSIS — R5381 Other malaise: Secondary | ICD-10-CM | POA: Diagnosis not present

## 2022-09-20 DIAGNOSIS — R5381 Other malaise: Secondary | ICD-10-CM | POA: Diagnosis not present

## 2022-09-21 DIAGNOSIS — R5381 Other malaise: Secondary | ICD-10-CM | POA: Diagnosis not present

## 2022-09-22 DIAGNOSIS — R5381 Other malaise: Secondary | ICD-10-CM | POA: Diagnosis not present

## 2022-09-23 DIAGNOSIS — R5381 Other malaise: Secondary | ICD-10-CM | POA: Diagnosis not present

## 2022-09-24 DIAGNOSIS — R5381 Other malaise: Secondary | ICD-10-CM | POA: Diagnosis not present

## 2022-09-25 DIAGNOSIS — R5381 Other malaise: Secondary | ICD-10-CM | POA: Diagnosis not present

## 2022-09-26 DIAGNOSIS — R5381 Other malaise: Secondary | ICD-10-CM | POA: Diagnosis not present

## 2022-09-27 DIAGNOSIS — R5381 Other malaise: Secondary | ICD-10-CM | POA: Diagnosis not present

## 2022-09-28 DIAGNOSIS — R5381 Other malaise: Secondary | ICD-10-CM | POA: Diagnosis not present

## 2022-09-29 DIAGNOSIS — R5381 Other malaise: Secondary | ICD-10-CM | POA: Diagnosis not present

## 2022-09-30 DIAGNOSIS — R5381 Other malaise: Secondary | ICD-10-CM | POA: Diagnosis not present

## 2022-10-01 DIAGNOSIS — R5381 Other malaise: Secondary | ICD-10-CM | POA: Diagnosis not present

## 2022-10-02 DIAGNOSIS — R5381 Other malaise: Secondary | ICD-10-CM | POA: Diagnosis not present

## 2022-10-03 DIAGNOSIS — R5381 Other malaise: Secondary | ICD-10-CM | POA: Diagnosis not present

## 2022-10-04 DIAGNOSIS — R5381 Other malaise: Secondary | ICD-10-CM | POA: Diagnosis not present

## 2022-10-05 DIAGNOSIS — R5381 Other malaise: Secondary | ICD-10-CM | POA: Diagnosis not present

## 2022-10-06 DIAGNOSIS — R5381 Other malaise: Secondary | ICD-10-CM | POA: Diagnosis not present

## 2022-10-07 DIAGNOSIS — R5381 Other malaise: Secondary | ICD-10-CM | POA: Diagnosis not present

## 2022-10-08 DIAGNOSIS — R5381 Other malaise: Secondary | ICD-10-CM | POA: Diagnosis not present

## 2022-10-09 DIAGNOSIS — R5381 Other malaise: Secondary | ICD-10-CM | POA: Diagnosis not present

## 2022-10-10 DIAGNOSIS — R5381 Other malaise: Secondary | ICD-10-CM | POA: Diagnosis not present

## 2022-10-11 DIAGNOSIS — R5381 Other malaise: Secondary | ICD-10-CM | POA: Diagnosis not present

## 2022-10-12 DIAGNOSIS — R5381 Other malaise: Secondary | ICD-10-CM | POA: Diagnosis not present

## 2022-10-13 DIAGNOSIS — R5381 Other malaise: Secondary | ICD-10-CM | POA: Diagnosis not present

## 2022-10-14 DIAGNOSIS — R5381 Other malaise: Secondary | ICD-10-CM | POA: Diagnosis not present

## 2022-10-15 DIAGNOSIS — R5381 Other malaise: Secondary | ICD-10-CM | POA: Diagnosis not present

## 2022-10-16 DIAGNOSIS — R5381 Other malaise: Secondary | ICD-10-CM | POA: Diagnosis not present

## 2022-10-17 DIAGNOSIS — R5381 Other malaise: Secondary | ICD-10-CM | POA: Diagnosis not present

## 2022-10-18 DIAGNOSIS — R5381 Other malaise: Secondary | ICD-10-CM | POA: Diagnosis not present

## 2022-10-19 DIAGNOSIS — R5381 Other malaise: Secondary | ICD-10-CM | POA: Diagnosis not present

## 2022-10-20 DIAGNOSIS — R5381 Other malaise: Secondary | ICD-10-CM | POA: Diagnosis not present

## 2022-10-21 DIAGNOSIS — R5381 Other malaise: Secondary | ICD-10-CM | POA: Diagnosis not present

## 2022-10-22 DIAGNOSIS — R5381 Other malaise: Secondary | ICD-10-CM | POA: Diagnosis not present

## 2022-10-23 DIAGNOSIS — R5381 Other malaise: Secondary | ICD-10-CM | POA: Diagnosis not present

## 2022-10-24 DIAGNOSIS — R5381 Other malaise: Secondary | ICD-10-CM | POA: Diagnosis not present

## 2022-10-25 DIAGNOSIS — R5381 Other malaise: Secondary | ICD-10-CM | POA: Diagnosis not present

## 2022-10-26 DIAGNOSIS — R5381 Other malaise: Secondary | ICD-10-CM | POA: Diagnosis not present

## 2022-10-27 DIAGNOSIS — R5381 Other malaise: Secondary | ICD-10-CM | POA: Diagnosis not present

## 2022-10-28 DIAGNOSIS — R5381 Other malaise: Secondary | ICD-10-CM | POA: Diagnosis not present

## 2022-10-29 DIAGNOSIS — R5381 Other malaise: Secondary | ICD-10-CM | POA: Diagnosis not present

## 2022-10-30 DIAGNOSIS — R5381 Other malaise: Secondary | ICD-10-CM | POA: Diagnosis not present

## 2022-10-30 DIAGNOSIS — G629 Polyneuropathy, unspecified: Secondary | ICD-10-CM | POA: Diagnosis not present

## 2022-10-31 DIAGNOSIS — R5381 Other malaise: Secondary | ICD-10-CM | POA: Diagnosis not present

## 2022-11-01 DIAGNOSIS — R5381 Other malaise: Secondary | ICD-10-CM | POA: Diagnosis not present

## 2022-11-02 DIAGNOSIS — R5381 Other malaise: Secondary | ICD-10-CM | POA: Diagnosis not present

## 2022-11-10 ENCOUNTER — Ambulatory Visit: Payer: Medicare HMO | Admitting: Family

## 2022-11-18 DIAGNOSIS — Z0100 Encounter for examination of eyes and vision without abnormal findings: Secondary | ICD-10-CM | POA: Diagnosis not present

## 2022-11-20 ENCOUNTER — Ambulatory Visit: Payer: Medicare HMO | Attending: Internal Medicine

## 2022-11-20 DIAGNOSIS — M6281 Muscle weakness (generalized): Secondary | ICD-10-CM | POA: Insufficient documentation

## 2022-11-20 NOTE — Therapy (Signed)
Patient Name: Richard Hebert MRN: [id number] DOB:03/20/1996, 27 y.o., male Today's Date: 11/20/2022  Patient screened rather than evaluation today. He presented with caregiver- Supervisor at his group home. She reported that the schedule would not work for them and stated that it was a taxing effort just to get him to clinic. She asked if PT here could come to the home. Explained difference between outpatient vs. Home health PT. She stated thankful for explanation and at this time wanted to pursue possible HHPT option as she feels this would be the best fit for his current needs. She stated she is taking him to his PCP on Tues 11/24/2022 and going to discuss obtaining order for HHPT.  No charge for today's visit.   Lenda Kelp, PT 11/20/2022, 11:36 AM

## 2022-11-24 ENCOUNTER — Ambulatory Visit (INDEPENDENT_AMBULATORY_CARE_PROVIDER_SITE_OTHER): Payer: Medicare HMO | Admitting: Family

## 2022-11-24 VITALS — BP 120/62 | HR 82 | Ht 68.0 in | Wt 123.0 lb

## 2022-11-24 DIAGNOSIS — I10 Essential (primary) hypertension: Secondary | ICD-10-CM

## 2022-11-24 DIAGNOSIS — F840 Autistic disorder: Secondary | ICD-10-CM

## 2022-11-24 DIAGNOSIS — D520 Dietary folate deficiency anemia: Secondary | ICD-10-CM

## 2022-11-24 DIAGNOSIS — E559 Vitamin D deficiency, unspecified: Secondary | ICD-10-CM

## 2022-11-24 DIAGNOSIS — R3981 Functional urinary incontinence: Secondary | ICD-10-CM

## 2022-11-24 DIAGNOSIS — E782 Mixed hyperlipidemia: Secondary | ICD-10-CM

## 2022-11-24 DIAGNOSIS — R7303 Prediabetes: Secondary | ICD-10-CM

## 2022-11-24 DIAGNOSIS — E039 Hypothyroidism, unspecified: Secondary | ICD-10-CM | POA: Diagnosis not present

## 2022-11-24 DIAGNOSIS — G709 Myoneural disorder, unspecified: Secondary | ICD-10-CM

## 2022-11-24 DIAGNOSIS — E1165 Type 2 diabetes mellitus with hyperglycemia: Secondary | ICD-10-CM | POA: Diagnosis not present

## 2022-11-24 DIAGNOSIS — D518 Other vitamin B12 deficiency anemias: Secondary | ICD-10-CM | POA: Diagnosis not present

## 2022-11-24 NOTE — Progress Notes (Signed)
Sleep questionnaire: 0/10

## 2022-11-25 LAB — VITAMIN B12: Vitamin B-12: 841 pg/mL (ref 232–1245)

## 2022-11-25 LAB — CMP14+EGFR
ALT: 16 IU/L (ref 0–44)
AST: 12 IU/L (ref 0–40)
Albumin/Globulin Ratio: 2 (ref 1.2–2.2)
Albumin: 4.8 g/dL (ref 4.3–5.2)
Alkaline Phosphatase: 88 IU/L (ref 44–121)
BUN/Creatinine Ratio: 9 (ref 9–20)
BUN: 8 mg/dL (ref 6–20)
Bilirubin Total: 1.5 mg/dL — ABNORMAL HIGH (ref 0.0–1.2)
CO2: 22 mmol/L (ref 20–29)
Calcium: 10.6 mg/dL — ABNORMAL HIGH (ref 8.7–10.2)
Chloride: 104 mmol/L (ref 96–106)
Creatinine, Ser: 0.89 mg/dL (ref 0.76–1.27)
Globulin, Total: 2.4 g/dL (ref 1.5–4.5)
Glucose: 75 mg/dL (ref 70–99)
Potassium: 4.4 mmol/L (ref 3.5–5.2)
Sodium: 144 mmol/L (ref 134–144)
Total Protein: 7.2 g/dL (ref 6.0–8.5)
eGFR: 122 mL/min/{1.73_m2} (ref >59)

## 2022-11-25 LAB — LIPID PANEL
Chol/HDL Ratio: 3.8 ratio (ref 0.0–5.0)
Cholesterol, Total: 189 mg/dL (ref 100–199)
HDL: 50 mg/dL (ref >39)
LDL Chol Calc (NIH): 123 mg/dL — ABNORMAL HIGH (ref 0–99)
Triglycerides: 90 mg/dL (ref 0–149)
VLDL Cholesterol Cal: 16 mg/dL (ref 5–40)

## 2022-11-25 LAB — HEMOGLOBIN A1C
Est. average glucose Bld gHb Est-mCnc: 105 mg/dL
Hgb A1c MFr Bld: 5.3 % (ref 4.8–5.6)

## 2022-11-25 LAB — CBC WITH DIFFERENTIAL
Basophils Absolute: 0.1 10*3/uL (ref 0.0–0.2)
Basos: 1 %
EOS (ABSOLUTE): 0.1 10*3/uL (ref 0.0–0.4)
Eos: 1 %
Hematocrit: 44 % (ref 37.5–51.0)
Hemoglobin: 14.4 g/dL (ref 13.0–17.7)
Immature Grans (Abs): 0 10*3/uL (ref 0.0–0.1)
Immature Granulocytes: 0 %
Lymphocytes Absolute: 1.8 10*3/uL (ref 0.7–3.1)
Lymphs: 25 %
MCH: 27.6 pg (ref 26.6–33.0)
MCHC: 32.7 g/dL (ref 31.5–35.7)
MCV: 84 fL (ref 79–97)
Monocytes Absolute: 0.5 10*3/uL (ref 0.1–0.9)
Monocytes: 7 %
Neutrophils Absolute: 4.9 10*3/uL (ref 1.4–7.0)
Neutrophils: 66 %
RBC: 5.22 x10E6/uL (ref 4.14–5.80)
RDW: 14.1 % (ref 11.6–15.4)
WBC: 7.4 10*3/uL (ref 3.4–10.8)

## 2022-11-25 LAB — VITAMIN D 25 HYDROXY (VIT D DEFICIENCY, FRACTURES): Vit D, 25-Hydroxy: 44.5 ng/mL (ref 30.0–100.0)

## 2022-11-25 LAB — TSH: TSH: 7.68 u[IU]/mL — ABNORMAL HIGH (ref 0.450–4.500)

## 2022-11-27 ENCOUNTER — Ambulatory Visit: Payer: Medicare HMO | Admitting: Physical Therapy

## 2022-11-27 DIAGNOSIS — L600 Ingrowing nail: Secondary | ICD-10-CM | POA: Diagnosis not present

## 2022-11-27 DIAGNOSIS — L03031 Cellulitis of right toe: Secondary | ICD-10-CM | POA: Diagnosis not present

## 2022-11-27 DIAGNOSIS — G629 Polyneuropathy, unspecified: Secondary | ICD-10-CM | POA: Diagnosis not present

## 2022-11-27 DIAGNOSIS — L03032 Cellulitis of left toe: Secondary | ICD-10-CM | POA: Diagnosis not present

## 2022-11-28 DIAGNOSIS — E559 Vitamin D deficiency, unspecified: Secondary | ICD-10-CM | POA: Insufficient documentation

## 2022-11-28 DIAGNOSIS — D519 Vitamin B12 deficiency anemia, unspecified: Secondary | ICD-10-CM | POA: Insufficient documentation

## 2022-11-30 ENCOUNTER — Telehealth: Payer: Self-pay

## 2022-11-30 ENCOUNTER — Ambulatory Visit: Payer: Medicare HMO | Admitting: Physical Therapy

## 2022-11-30 DIAGNOSIS — G629 Polyneuropathy, unspecified: Secondary | ICD-10-CM | POA: Diagnosis not present

## 2022-11-30 NOTE — Telephone Encounter (Signed)
Trenette called and left vm regarding pt said they had appt with dietician & they recommend that pt take a pre-natal vitamin for his anemia? She just wanted to check with you about this as well please advise

## 2022-12-01 DIAGNOSIS — G7089 Other specified myoneural disorders: Secondary | ICD-10-CM | POA: Insufficient documentation

## 2022-12-03 ENCOUNTER — Encounter: Payer: Self-pay | Admitting: Family

## 2022-12-03 DIAGNOSIS — E559 Vitamin D deficiency, unspecified: Secondary | ICD-10-CM | POA: Insufficient documentation

## 2022-12-03 DIAGNOSIS — G709 Myoneural disorder, unspecified: Secondary | ICD-10-CM | POA: Insufficient documentation

## 2022-12-03 DIAGNOSIS — R3981 Functional urinary incontinence: Secondary | ICD-10-CM | POA: Insufficient documentation

## 2022-12-03 MED ORDER — PRENATAL 27-0.8 MG PO TABS: 1.0000 | ORAL_TABLET | Freq: Every day | ORAL | 2 refills | Status: DC

## 2022-12-03 MED ORDER — DESITIN DAILY DEFENSE 13 % EX CREA: 1.0000 | TOPICAL_CREAM | CUTANEOUS | 5 refills | Status: AC | PRN

## 2022-12-03 NOTE — Assessment & Plan Note (Signed)
Setting up with diapers/supplies due to his incontinence.  Also sending RX for Desitin.  Encouraged continuing q2h toileting schedule.

## 2022-12-03 NOTE — Assessment & Plan Note (Signed)
>>  ASSESSMENT AND PLAN FOR VITAMIN B12 DEFICIENCY (DIETARY) ANEMIA WRITTEN ON 12/03/2022 10:30 AM BY Miki Kins, FNP  Checking labs today.  Will continue supplements as needed.

## 2022-12-03 NOTE — Assessment & Plan Note (Signed)
Checking labs today.  Will continue supplements as needed.  

## 2022-12-03 NOTE — Progress Notes (Signed)
New Patient Office Visit  Subjective    Patient ID: Richard Hebert, male    DOB: 28-Dec-1995  Age: 27 y.o. MRN: [id number]  CC:  Chief Complaint  Patient presents with   Establish Care    New Patient    HPI Richard Hebert presents to establish care  His caregiver does have additional concerns to discuss today.   He needs the below: Bed Rails - patient has very strong movements, but is not stable for ambulation alone, so the group home has asked if we can get them a set of lower bed rails to help maintain safety for him.   Desitin Diapers/Pull ups - Medium size.  Every 2 hours toileting.  Need probably 7-8 per day. This is unlikely to improve with time, and is medically necessary to prevent skin breakdown and preserve what function he does have daily.   Order for home PT - was walking prior to January, stopped suddenly.  Went to hospital was diagnosed with Neuromuscular.  He was going to hospital for PT, but this is difficult for him to manage, given issues with getting him ready and transporting him to the hospital.   He also needs a referral to Dr. Mila Hebert - psych in [place]. - patient needs set up with psych and this is the psychiatric provider the group home typically uses for other clients.   No other concerns today.    Outpatient Encounter Medications as of 11/24/2022  Medication Sig   Baclofen 5 MG TABS Take 1 tablet by mouth 2 (two) times daily.   bisacodyl (DULCOLAX) 10 MG suppository Place 10 mg rectally once.   gabapentin (NEURONTIN) 300 MG capsule Take 1 capsule by mouth at bedtime.   hydrOXYzine (ATARAX) 10 MG tablet Take 10 mg by mouth every 6 (six) hours as needed for itching or anxiety.   levothyroxine (SYNTHROID) 100 MCG tablet Take 100 mcg by mouth daily before breakfast.   melatonin 3 MG TABS tablet Take 3 mg by mouth at bedtime.   metoCLOPramide (REGLAN) 5 MG tablet Take 5 mg by mouth 3 (three) times daily before meals.   metoprolol tartrate (LOPRESSOR) 25 MG  tablet Take 1 tablet (25 mg total) by mouth 2 (two) times daily.   polyethylene glycol (MIRALAX / GLYCOLAX) 17 g packet Take 17 g by mouth daily as needed for mild constipation or moderate constipation.   potassium chloride SA (KLOR-CON M) 20 MEQ tablet Take 40 mEq by mouth daily.   Prenatal Vit-Fe Fumarate-FA (MULTIVITAMIN-PRENATAL) 27-0.8 MG TABS tablet Take 1 tablet by mouth daily at 12 noon.   tamsulosin (FLOMAX) 0.4 MG CAPS capsule Take 0.4 mg by mouth daily.   traZODone (DESYREL) 100 MG tablet Take 1 tablet by mouth at bedtime.   Zinc Oxide (DESITIN DAILY DEFENSE) 13 % CREA Apply 1 Application topically as needed (with changes and as needed.).   cyanocobalamin (VITAMIN B12) 1000 MCG tablet Take 1 tablet (1,000 mcg total) by mouth daily.   folic acid (FOLVITE) 1 MG tablet Take 1 tablet (1 mg total) by mouth daily.   haloperidol (HALDOL) 2 MG tablet Take 1 tablet (2 mg total) by mouth every 6 (six) hours as needed for agitation.   [DISCONTINUED] levothyroxine (SYNTHROID) 75 MCG tablet Take 1 tablet (75 mcg total) by mouth daily at 6 (six) AM.   [DISCONTINUED] traZODone (DESYREL) 50 MG tablet Take 0.5 tablets (25 mg total) by mouth at bedtime as needed for sleep.   No facility-administered encounter medications  on file as of 11/24/2022.    Past Medical History:  Diagnosis Date   Hypokalemia 07/14/2022   Formatting of this note might be different from the original. Last Assessment & Plan:  Formatting of this note might be different from the original.  - This could certainly be contributing to his muscle weakness.  - We will aggressively replace potassium and follow.   Hyponatremia 07/26/2022   Metabolic acidosis 07/23/2022   Reactive thrombocytosis 07/23/2022   Thyroid disease     No past surgical history on file.  Family History  Problem Relation Age of Onset   Heart disease Maternal Aunt     Social History   Socioeconomic History   Marital status: Single    Spouse name: Not on  file   Number of children: Not on file   Years of education: Not on file   Highest education level: Not on file  Occupational History   Not on file  Tobacco Use   Smoking status: Never   Smokeless tobacco: Never  Substance and Sexual Activity   Alcohol use: Never   Drug use: Never   Sexual activity: Not on file  Other Topics Concern   Not on file  Social History Narrative   Not on file   Social Determinants of Health   Financial Resource Strain: Not on file  Food Insecurity: No Food Insecurity (07/23/2022)   Hunger Vital Sign    Worried About Running Out of Food in the Last Year: Never true    Ran Out of Food in the Last Year: Never true  Transportation Needs: No Transportation Needs (07/23/2022)   PRAPARE - Administrator, Civil Service (Medical): No    Lack of Transportation (Non-Medical): No  Physical Activity: Not on file  Stress: Not on file  Social Connections: Not on file  Intimate Partner Violence: Not At Risk (07/23/2022)   Humiliation, Afraid, Rape, and Kick questionnaire    Fear of Current or Ex-Partner: No    Emotionally Abused: No    Physically Abused: No    Sexually Abused: No    Review of Systems  Unable to perform ROS: Other (Patient is verbal, but does not always respond to questions directly.)  Psychiatric/Behavioral:  Positive for substance abuse.   All other systems reviewed and are negative.     Objective    BP 120/62   Pulse 82   Ht 5\' 8"  (1.727 m)   Wt 123 lb (55.8 kg)   SpO2 97%   BMI 18.70 kg/m   Physical Exam Vitals reviewed.  Constitutional:      General: He is not in acute distress.    Appearance: Normal appearance. He is not ill-appearing or toxic-appearing.  Cardiovascular:     Rate and Rhythm: Normal rate and regular rhythm.  Pulmonary:     Effort: Pulmonary effort is normal.  Musculoskeletal:     Comments: Generalized muscular spasticity, patient had recent   Neurological:     Mental Status: He is alert.      Motor: Weakness and abnormal muscle tone (spasticity.) present. No seizure activity.     Coordination: Coordination abnormal.     Gait: Gait abnormal.     Deep Tendon Reflexes: Reflexes abnormal.        Assessment & Plan:   Problem List Items Addressed This Visit       Active Problems   Autism    Will send referral to Dr. Mila Hebert in [facility] for psychiatry.  Hypothyroidism    Patient stable.  Well controlled with current therapy.  Continue current meds.       Relevant Medications   levothyroxine (SYNTHROID) 100 MCG tablet   Other Relevant Orders   CBC With Differential (Completed)   CMP14+EGFR (Completed)   TSH (Completed)   Vitamin B12 deficiency (dietary) anemia - Primary    Checking labs today.  Will continue supplements as needed.        Relevant Orders   CBC With Differential (Completed)   CMP14+EGFR (Completed)   Vitamin B12 (Completed)   Folate deficiency anemia    Checking labs today.  Will continue supplements as needed.       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  Also sending RX for Desitin.  Encouraged continuing q2h toileting schedule.       Relevant Medications   tamsulosin (FLOMAX) 0.4 MG CAPS capsule   Vitamin D deficiency    Checking labs today.  Will continue supplements as needed.        Relevant Orders   VITAMIN D 25 Hydroxy (Vit-D Deficiency, Fractures) (Completed)   CBC With Differential (Completed)   CMP14+EGFR (Completed)   Neuromuscular disorder (HCC)   Relevant Medications   Baclofen 5 MG TABS   gabapentin (NEURONTIN) 300 MG capsule   traZODone (DESYREL) 100 MG tablet   hydrOXYzine (ATARAX) 10 MG tablet   Other Relevant Orders   For home use only DME Other see comment   Other Visit Diagnoses     Mixed hyperlipidemia       Relevant Orders   Lipid panel (Completed)    CBC With Differential (Completed)   CMP14+EGFR (Completed)   Essential hypertension, benign       Relevant Orders   CBC With Differential (Completed)   CMP14+EGFR (Completed)   Prediabetes       Relevant Orders   Hemoglobin A1c (Completed)       Return in about 3 months (around 02/24/2023) for F/U.   Total time spent: 45 minutes  Miki Kins, FNP  11/24/2022  This document may have been prepared by [facility] Voice Recognition software and as such may include unintentional dictation errors.

## 2022-12-03 NOTE — Assessment & Plan Note (Signed)
Will send referral to Dr. Mila Palmer in [facility] for psychiatry.

## 2022-12-03 NOTE — Assessment & Plan Note (Signed)
Patient stable.  Well controlled with current therapy.   Continue current meds.   

## 2022-12-04 ENCOUNTER — Ambulatory Visit: Payer: Medicare HMO | Admitting: Physical Therapy

## 2022-12-04 NOTE — Telephone Encounter (Signed)
[facility]ette informed, sending orders to [facility]

## 2022-12-07 ENCOUNTER — Ambulatory Visit: Payer: Medicare HMO | Admitting: Physical Therapy

## 2022-12-07 DIAGNOSIS — R7303 Prediabetes: Secondary | ICD-10-CM | POA: Diagnosis not present

## 2022-12-07 DIAGNOSIS — F840 Autistic disorder: Secondary | ICD-10-CM | POA: Diagnosis not present

## 2022-12-07 DIAGNOSIS — E039 Hypothyroidism, unspecified: Secondary | ICD-10-CM | POA: Diagnosis not present

## 2022-12-07 DIAGNOSIS — I69815 Cognitive social or emotional deficit following other cerebrovascular disease: Secondary | ICD-10-CM | POA: Diagnosis not present

## 2022-12-07 DIAGNOSIS — I69891 Dysphagia following other cerebrovascular disease: Secondary | ICD-10-CM | POA: Diagnosis not present

## 2022-12-07 DIAGNOSIS — R32 Unspecified urinary incontinence: Secondary | ICD-10-CM | POA: Diagnosis not present

## 2022-12-07 DIAGNOSIS — I10 Essential (primary) hypertension: Secondary | ICD-10-CM | POA: Diagnosis not present

## 2022-12-07 DIAGNOSIS — G7089 Other specified myoneural disorders: Secondary | ICD-10-CM | POA: Diagnosis not present

## 2022-12-07 DIAGNOSIS — E785 Hyperlipidemia, unspecified: Secondary | ICD-10-CM | POA: Diagnosis not present

## 2022-12-07 DIAGNOSIS — D519 Vitamin B12 deficiency anemia, unspecified: Secondary | ICD-10-CM | POA: Diagnosis not present

## 2022-12-07 DIAGNOSIS — E559 Vitamin D deficiency, unspecified: Secondary | ICD-10-CM | POA: Diagnosis not present

## 2022-12-08 ENCOUNTER — Telehealth: Payer: Self-pay | Admitting: Family

## 2022-12-08 NOTE — Telephone Encounter (Signed)
Clydie Braun, speech therapist with [facility], called to let us know that it will be next week before she can go out and do an assessment on this patient. Just FYI.

## 2022-12-09 ENCOUNTER — Ambulatory Visit: Payer: Medicare HMO | Admitting: Physical Therapy

## 2022-12-14 ENCOUNTER — Telehealth: Payer: Self-pay | Admitting: Family

## 2022-12-14 NOTE — Telephone Encounter (Signed)
Need to fax bed rails order to [phone], [facility] in [place].

## 2022-12-16 ENCOUNTER — Ambulatory Visit: Payer: Medicare HMO | Admitting: Physical Therapy

## 2022-12-21 ENCOUNTER — Ambulatory Visit: Payer: Medicare HMO

## 2022-12-23 ENCOUNTER — Other Ambulatory Visit: Payer: Self-pay | Admitting: Family

## 2022-12-23 ENCOUNTER — Ambulatory Visit: Payer: Medicare HMO | Admitting: Physical Therapy

## 2022-12-24 ENCOUNTER — Other Ambulatory Visit: Payer: Self-pay | Admitting: Family

## 2022-12-25 ENCOUNTER — Telehealth: Payer: Self-pay

## 2022-12-25 NOTE — Telephone Encounter (Signed)
Richard Hebert [facility] PT called to report a missed visit

## 2022-12-28 DIAGNOSIS — G629 Polyneuropathy, unspecified: Secondary | ICD-10-CM | POA: Diagnosis not present

## 2022-12-30 ENCOUNTER — Telehealth: Payer: Self-pay | Admitting: Family

## 2022-12-30 DIAGNOSIS — G629 Polyneuropathy, unspecified: Secondary | ICD-10-CM | POA: Diagnosis not present

## 2022-12-30 NOTE — Telephone Encounter (Signed)
Spoke with Trenette and she needs 90 day orders for this patient. Please advise. 

## 2023-01-04 ENCOUNTER — Ambulatory Visit: Payer: Medicare HMO | Admitting: Physical Therapy

## 2023-01-06 ENCOUNTER — Ambulatory Visit: Payer: Medicare HMO | Admitting: Physical Therapy

## 2023-01-08 ENCOUNTER — Telehealth: Payer: Self-pay | Admitting: Family

## 2023-01-08 NOTE — Telephone Encounter (Signed)
Qamar, PT with [facility], left VM needing an order for a walker for this patient. Please advise.

## 2023-01-11 ENCOUNTER — Ambulatory Visit: Payer: Medicare HMO

## 2023-01-11 ENCOUNTER — Ambulatory Visit: Payer: Medicare HMO | Admitting: Physical Therapy

## 2023-01-13 ENCOUNTER — Ambulatory Visit: Payer: Medicare HMO

## 2023-01-14 MED ORDER — GABAPENTIN 300 MG PO CAPS: 300.0000 mg | ORAL_CAPSULE | Freq: Every day | ORAL | 3 refills | Status: DC

## 2023-01-14 MED ORDER — VITAMIN B-12 1000 MCG PO TABS: 1000.0000 ug | ORAL_TABLET | Freq: Every day | ORAL | 3 refills | Status: DC

## 2023-01-14 MED ORDER — TRAZODONE HCL 100 MG PO TABS: 100.0000 mg | ORAL_TABLET | Freq: Every day | ORAL | 3 refills | Status: DC

## 2023-01-14 MED ORDER — HALOPERIDOL 2 MG PO TABS: 2.0000 mg | ORAL_TABLET | Freq: Four times a day (QID) | ORAL | 3 refills | Status: AC | PRN

## 2023-01-14 MED ORDER — MELATONIN 3 MG PO TABS: 3.0000 mg | ORAL_TABLET | Freq: Every day | ORAL | 3 refills | Status: DC

## 2023-01-14 MED ORDER — LEVOTHYROXINE SODIUM 100 MCG PO TABS: 100.0000 ug | ORAL_TABLET | Freq: Every day | ORAL | 3 refills | Status: DC

## 2023-01-14 MED ORDER — METOPROLOL TARTRATE 25 MG PO TABS: 12.5000 mg | ORAL_TABLET | Freq: Two times a day (BID) | ORAL | 3 refills | Status: DC

## 2023-01-14 MED ORDER — HYDROXYZINE HCL 10 MG PO TABS: 10.0000 mg | ORAL_TABLET | Freq: Four times a day (QID) | ORAL | 3 refills | Status: DC | PRN

## 2023-01-14 MED ORDER — POTASSIUM CHLORIDE CRYS ER 20 MEQ PO TBCR: 40.0000 meq | EXTENDED_RELEASE_TABLET | Freq: Every day | ORAL | 3 refills | Status: DC

## 2023-01-14 MED ORDER — BACLOFEN 5 MG PO TABS: ORAL_TABLET | ORAL | 3 refills | Status: DC

## 2023-01-14 MED ORDER — METOCLOPRAMIDE HCL 5 MG PO TABS: 5.0000 mg | ORAL_TABLET | Freq: Three times a day (TID) | ORAL | 3 refills | Status: DC

## 2023-01-14 MED ORDER — POLYETHYLENE GLYCOL 3350 17 G PO PACK: 17.0000 g | PACK | Freq: Every day | ORAL | 3 refills | Status: AC | PRN

## 2023-01-14 MED ORDER — FOLIC ACID 1 MG PO TABS: 1.0000 mg | ORAL_TABLET | Freq: Every day | ORAL | 3 refills | Status: DC

## 2023-01-14 MED ORDER — PRENATAL 27-0.8 MG PO TABS: 1.0000 | ORAL_TABLET | Freq: Every day | ORAL | 2 refills | Status: DC

## 2023-01-14 MED ORDER — TAMSULOSIN HCL 0.4 MG PO CAPS: ORAL_CAPSULE | ORAL | 3 refills | Status: DC

## 2023-01-14 NOTE — Addendum Note (Signed)
Addended by: Grayling Congress on: 01/14/2023 04:46 PM   Modules accepted: Orders

## 2023-01-18 ENCOUNTER — Ambulatory Visit: Payer: Medicare HMO | Admitting: Physical Therapy

## 2023-01-20 ENCOUNTER — Ambulatory Visit: Payer: Medicare HMO | Admitting: Physical Therapy

## 2023-01-20 NOTE — Addendum Note (Signed)
Addended by: Grayling Congress on: 01/20/2023 10:41 AM   Modules accepted: Orders

## 2023-01-26 ENCOUNTER — Ambulatory Visit: Payer: Medicare HMO

## 2023-01-28 ENCOUNTER — Ambulatory Visit: Payer: Medicare HMO

## 2023-01-29 ENCOUNTER — Telehealth: Payer: Self-pay

## 2023-01-29 NOTE — Telephone Encounter (Signed)
Mika with [facility] called to get verbal okay to hold visit due to some of the group home members testing positive for covid

## 2023-01-30 DIAGNOSIS — G629 Polyneuropathy, unspecified: Secondary | ICD-10-CM | POA: Diagnosis not present

## 2023-02-02 ENCOUNTER — Ambulatory Visit: Payer: Medicare HMO

## 2023-02-02 DIAGNOSIS — G629 Polyneuropathy, unspecified: Secondary | ICD-10-CM | POA: Diagnosis not present

## 2023-02-05 ENCOUNTER — Ambulatory Visit: Payer: Medicare HMO

## 2023-02-08 ENCOUNTER — Ambulatory Visit: Payer: Medicare HMO

## 2023-02-10 ENCOUNTER — Ambulatory Visit: Payer: Medicare HMO

## 2023-02-15 ENCOUNTER — Ambulatory Visit: Payer: Medicare HMO

## 2023-02-18 ENCOUNTER — Ambulatory Visit: Payer: Medicare HMO

## 2023-02-22 ENCOUNTER — Ambulatory Visit: Payer: Medicare HMO

## 2023-02-23 ENCOUNTER — Ambulatory Visit (INDEPENDENT_AMBULATORY_CARE_PROVIDER_SITE_OTHER): Payer: Medicare HMO | Admitting: Family

## 2023-02-23 VITALS — BP 90/50 | HR 69 | Wt 119.8 lb

## 2023-02-23 DIAGNOSIS — L700 Acne vulgaris: Secondary | ICD-10-CM

## 2023-02-23 DIAGNOSIS — E559 Vitamin D deficiency, unspecified: Secondary | ICD-10-CM

## 2023-02-23 DIAGNOSIS — I10 Essential (primary) hypertension: Secondary | ICD-10-CM | POA: Diagnosis not present

## 2023-02-23 DIAGNOSIS — D518 Other vitamin B12 deficiency anemias: Secondary | ICD-10-CM | POA: Diagnosis not present

## 2023-02-23 DIAGNOSIS — R29898 Other symptoms and signs involving the musculoskeletal system: Secondary | ICD-10-CM

## 2023-02-23 DIAGNOSIS — E782 Mixed hyperlipidemia: Secondary | ICD-10-CM

## 2023-02-23 DIAGNOSIS — G709 Myoneural disorder, unspecified: Secondary | ICD-10-CM | POA: Diagnosis not present

## 2023-02-23 DIAGNOSIS — E039 Hypothyroidism, unspecified: Secondary | ICD-10-CM

## 2023-02-23 DIAGNOSIS — R7303 Prediabetes: Secondary | ICD-10-CM

## 2023-02-23 DIAGNOSIS — G629 Polyneuropathy, unspecified: Secondary | ICD-10-CM | POA: Diagnosis not present

## 2023-02-23 DIAGNOSIS — D529 Folate deficiency anemia, unspecified: Secondary | ICD-10-CM | POA: Diagnosis not present

## 2023-02-23 MED ORDER — CLINDAMYCIN PHOSPHATE 1 % EX GEL
Freq: Two times a day (BID) | CUTANEOUS | 0 refills | Status: AC
Start: 2023-02-23 — End: ?

## 2023-02-24 LAB — CMP14+EGFR
ALT: 26 IU/L (ref 0–44)
AST: 18 IU/L (ref 0–40)
Albumin: 4.7 g/dL (ref 4.3–5.2)
Alkaline Phosphatase: 57 IU/L (ref 44–121)
BUN/Creatinine Ratio: 11 (ref 9–20)
BUN: 10 mg/dL (ref 6–20)
Bilirubin Total: 0.9 mg/dL (ref 0.0–1.2)
CO2: 25 mmol/L (ref 20–29)
Calcium: 10 mg/dL (ref 8.7–10.2)
Chloride: 104 mmol/L (ref 96–106)
Creatinine, Ser: 0.87 mg/dL (ref 0.76–1.27)
Globulin, Total: 2 g/dL (ref 1.5–4.5)
Glucose: 60 mg/dL — ABNORMAL LOW (ref 70–99)
Potassium: 4.1 mmol/L (ref 3.5–5.2)
Sodium: 144 mmol/L (ref 134–144)
Total Protein: 6.7 g/dL (ref 6.0–8.5)
eGFR: 123 mL/min/{1.73_m2} (ref >59)

## 2023-02-24 LAB — LIPID PANEL
Chol/HDL Ratio: 2.9 ratio (ref 0.0–5.0)
Cholesterol, Total: 155 mg/dL (ref 100–199)
HDL: 53 mg/dL (ref >39)
LDL Chol Calc (NIH): 80 mg/dL (ref 0–99)
Triglycerides: 126 mg/dL (ref 0–149)
VLDL Cholesterol Cal: 22 mg/dL (ref 5–40)

## 2023-02-24 LAB — VITAMIN D 25 HYDROXY (VIT D DEFICIENCY, FRACTURES): Vit D, 25-Hydroxy: 38.3 ng/mL (ref 30.0–100.0)

## 2023-02-24 LAB — VITAMIN B12: Vitamin B-12: 988 pg/mL (ref 232–1245)

## 2023-02-24 LAB — HEMOGLOBIN A1C
Est. average glucose Bld gHb Est-mCnc: 97 mg/dL
Hgb A1c MFr Bld: 5 % (ref 4.8–5.6)

## 2023-02-24 LAB — TSH: TSH: 2.99 u[IU]/mL (ref 0.450–4.500)

## 2023-02-25 ENCOUNTER — Ambulatory Visit: Payer: Medicare HMO

## 2023-03-02 DIAGNOSIS — G629 Polyneuropathy, unspecified: Secondary | ICD-10-CM | POA: Diagnosis not present

## 2023-03-03 ENCOUNTER — Telehealth: Payer: Self-pay | Admitting: Family

## 2023-03-03 ENCOUNTER — Ambulatory Visit: Payer: Medicare HMO

## 2023-03-03 NOTE — Telephone Encounter (Signed)
Joellen Jersey, PT with [facility], left VM requesting that we refer the patient to an orthotist to get some special shoes made because they are having difficulty keeping the patient's shoes on. She recommended [facility] may be able to do this.  Callback # [phone]

## 2023-03-05 ENCOUNTER — Encounter: Payer: Self-pay | Admitting: Family

## 2023-03-05 DIAGNOSIS — G629 Polyneuropathy, unspecified: Secondary | ICD-10-CM | POA: Diagnosis not present

## 2023-03-05 DIAGNOSIS — L700 Acne vulgaris: Secondary | ICD-10-CM | POA: Insufficient documentation

## 2023-03-05 NOTE — Assessment & Plan Note (Signed)
Patient stable.  Well controlled with current therapy.   Continue current meds.  

## 2023-03-05 NOTE — Assessment & Plan Note (Signed)
Checking labs today.  Will continue supplements as needed.  

## 2023-03-05 NOTE — Assessment & Plan Note (Signed)
See A&P for Neuromuscular disorder.

## 2023-03-05 NOTE — Assessment & Plan Note (Signed)
Sending RX for clindagel.  Caregiver informed.   Recheck at follow up.

## 2023-03-05 NOTE — Assessment & Plan Note (Signed)
Condition is improving with physical therapy.  Will refer to [facility] for Orthotics at the request of PT - he needs shoes as they are having trouble keeping his on him.   Recheck at follow up.

## 2023-03-05 NOTE — Telephone Encounter (Signed)
Notified caretaker who verbalized understanding.

## 2023-03-05 NOTE — Assessment & Plan Note (Signed)
>>  ASSESSMENT AND PLAN FOR VITAMIN B12 DEFICIENCY (DIETARY) ANEMIA WRITTEN ON 03/05/2023  9:25 AM BY Miki Kins, FNP  Checking labs today.  Will continue supplements as needed.

## 2023-03-05 NOTE — Progress Notes (Signed)
Complete physical exam  Patient: Richard Hebert   DOB: Aug 02, 1995   26 y.o. Male  MRN: [id number]  Subjective:    Chief Complaint  Patient presents with   Annual Exam    physical    Richard Hebert is a 27 y.o. male who presents today for a complete physical exam. He reports consuming a general diet.  He generally feels well. He reports sleeping well. He does not have additional problems to discuss today.    Most recent fall risk assessment:    07/13/2022    1:56 PM  Fall Risk   Falls in the past year? 0  Number falls in past yr: 0     Most recent depression screenings:    11/24/2022    6:00 PM 07/13/2022    1:57 PM  PHQ 2/9 Scores  PHQ - 2 Score 1 0  PHQ- 9 Score  3    Past Medical History:  Diagnosis Date   Hypokalemia 07/14/2022   Formatting of this note might be different from the original. Last Assessment & Plan:  Formatting of this note might be different from the original.  - This could certainly be contributing to his muscle weakness.  - We will aggressively replace potassium and follow.   Hyponatremia 07/26/2022   Metabolic acidosis 07/23/2022   Reactive thrombocytosis 07/23/2022   Thyroid disease     No past surgical history on file.  Family History  Problem Relation Age of Onset   Heart disease Maternal Aunt     Social History   Socioeconomic History   Marital status: Single    Spouse name: Not on file   Number of children: Not on file   Years of education: Not on file   Highest education level: Not on file  Occupational History   Not on file  Tobacco Use   Smoking status: Never   Smokeless tobacco: Never  Substance and Sexual Activity   Alcohol use: Never   Drug use: Never   Sexual activity: Not on file  Other Topics Concern   Not on file  Social History Narrative   Not on file   Social Determinants of Health   Financial Resource Strain: Low Risk  (08/07/2022)   Received from [facility], [facility]   Overall Financial Resource Strain (CARDIA)    Difficulty of Paying Living Expenses: Not hard at all  Food Insecurity: No Food Insecurity (08/07/2022)   Received from [facility], [facility]   Hunger Vital Sign    Worried About Running Out of Food in the Last Year: Never true    Ran Out of Food in the Last Year: Never true  Transportation Needs: No Transportation Needs (08/07/2022)   Received from [facility], [facility]   [facility] - Transportation    In the past 12 months, has lack of transportation kept you from medical appointments or from getting medications?: No    Lack of Transportation (Non-Medical): No  Physical Activity: Not on file  Stress: Not on file  Social Connections: Not on file  Intimate Partner Violence: Not At Risk (07/23/2022)   Humiliation, Afraid, Rape, and Kick questionnaire    Fear of Current or Ex-Partner: No    Emotionally Abused: No    Physically Abused: No    Sexually Abused: No    Outpatient Medications Prior to Visit  Medication Sig   Baclofen 5 MG TABS TAKE  ONE TABLET BY MOUTH TWICE DAILY AND TAKE 1 TABLET BY MOUTH 3 TIMES A DAY AS NEEDED   cyanocobalamin (VITAMIN B12) 1000 MCG tablet Take 1 tablet (1,000 mcg total) by mouth daily.   folic acid (FOLVITE) 1 MG tablet Take 1 tablet (1 mg total) by mouth daily.   gabapentin (NEURONTIN) 300 MG capsule Take 1 capsule (300 mg total) by mouth at bedtime.   haloperidol (HALDOL) 2 MG tablet Take 1 tablet (2 mg total) by mouth every 6 (six) hours as needed for agitation.   hydrOXYzine (ATARAX) 10 MG tablet Take 1 tablet (10 mg total) by mouth every 6 (six) hours as needed for itching or anxiety.   levothyroxine (SYNTHROID) 100 MCG tablet Take 1 tablet (100 mcg total) by mouth daily before breakfast.   melatonin 3 MG TABS tablet Take 1 tablet (3 mg total) by mouth at bedtime.   metoCLOPramide (REGLAN) 5 MG tablet Take 1 tablet (5 mg  total) by mouth 3 (three) times daily before meals.   metoprolol tartrate (LOPRESSOR) 25 MG tablet Take 0.5 tablets (12.5 mg total) by mouth every 12 (twelve) hours.   polyethylene glycol (MIRALAX / GLYCOLAX) 17 g packet Take 17 g by mouth daily as needed for mild constipation or moderate constipation.   potassium chloride SA (KLOR-CON M) 20 MEQ tablet Take 2 tablets (40 mEq total) by mouth daily.   Prenatal Vit-Fe Fumarate-FA (MULTIVITAMIN-PRENATAL) 27-0.8 MG TABS tablet Take 1 tablet by mouth daily at 12 noon.   tamsulosin (FLOMAX) 0.4 MG CAPS capsule TAKE 1 CAPSULE BY MOUTH EVERY DAY 30 MINUTES AFTER SAME MEAL EACH DAY   traZODone (DESYREL) 100 MG tablet Take 1 tablet (100 mg total) by mouth at bedtime.   Zinc Oxide (DESITIN DAILY DEFENSE) 13 % CREA Apply 1 Application topically as needed (with changes and as needed.).   No facility-administered medications prior to visit.    Review of Systems  Neurological:  Positive for weakness.       Spasticity and contracture.   All other systems reviewed and are negative.       Objective:     BP (!) 90/50   Pulse 69   Wt 119 lb 12.8 oz (54.3 kg)   SpO2 99%   BMI 18.22 kg/m    Physical Exam Vitals and nursing note reviewed.  Constitutional:      Appearance: Normal appearance. He is normal weight.  Eyes:     Pupils: Pupils are equal, round, and reactive to light.  Cardiovascular:     Rate and Rhythm: Normal rate and regular rhythm.     Pulses: Normal pulses.     Heart sounds: Normal heart sounds.  Pulmonary:     Effort: Pulmonary effort is normal.     Breath sounds: Normal breath sounds.  Neurological:     General: No focal deficit present.     Mental Status: He is alert and oriented to person, place, and time. Mental status is at baseline.     Motor: Weakness, atrophy and abnormal muscle tone present.     Gait: Gait abnormal.     Deep Tendon Reflexes: Reflexes abnormal.  Psychiatric:        Mood and Affect: Mood normal.         Behavior: Behavior normal.      Results for orders placed or performed in visit on 02/23/23  Lipid panel  Result Value Ref Range   Cholesterol, Total 155 100 - 199 mg/dL   Triglycerides 161  0 - 149 mg/dL   HDL 53 >09 mg/dL   VLDL Cholesterol Cal 22 5 - 40 mg/dL   LDL Chol Calc (NIH) 80 0 - 99 mg/dL   Chol/HDL Ratio 2.9 0.0 - 5.0 ratio  VITAMIN D 25 Hydroxy (Vit-D Deficiency, Fractures)  Result Value Ref Range   Vit D, 25-Hydroxy 38.3 30.0 - 100.0 ng/mL  CMP14+EGFR  Result Value Ref Range   Glucose 60 (L) 70 - 99 mg/dL   BUN 10 6 - 20 mg/dL   Creatinine, Ser 3.81 0.76 - 1.27 mg/dL   eGFR 829 >93 ZJ/IRC/7.89   BUN/Creatinine Ratio 11 9 - 20   Sodium 144 134 - 144 mmol/L   Potassium 4.1 3.5 - 5.2 mmol/L   Chloride 104 96 - 106 mmol/L   CO2 25 20 - 29 mmol/L   Calcium 10.0 8.7 - 10.2 mg/dL   Total Protein 6.7 6.0 - 8.5 g/dL   Albumin 4.7 4.3 - 5.2 g/dL   Globulin, Total 2.0 1.5 - 4.5 g/dL   Bilirubin Total 0.9 0.0 - 1.2 mg/dL   Alkaline Phosphatase 57 44 - 121 IU/L   AST 18 0 - 40 IU/L   ALT 26 0 - 44 IU/L  TSH  Result Value Ref Range   TSH 2.990 0.450 - 4.500 uIU/mL  Hemoglobin A1c  Result Value Ref Range   Hgb A1c MFr Bld 5.0 4.8 - 5.6 %   Est. average glucose Bld gHb Est-mCnc 97 mg/dL  Vitamin F81  Result Value Ref Range   Vitamin B-12 988 232 - 1,245 pg/mL    Recent Results (from the past 2160 hour(s))  Lipid panel     Status: None   Collection Time: 02/23/23 10:30 AM  Result Value Ref Range   Cholesterol, Total 155 100 - 199 mg/dL   Triglycerides 017 0 - 149 mg/dL   HDL 53 >51 mg/dL   VLDL Cholesterol Cal 22 5 - 40 mg/dL   LDL Chol Calc (NIH) 80 0 - 99 mg/dL   Chol/HDL Ratio 2.9 0.0 - 5.0 ratio    Comment:                                   T. Chol/HDL Ratio                                             Men  Women                               1/2 Avg.Risk  3.4    3.3                                   Avg.Risk  5.0    4.4                                 2X Avg.Risk  9.6    7.1                                3X Avg.Risk 23.4   11.0  VITAMIN D 25 Hydroxy (Vit-D Deficiency, Fractures)     Status: None   Collection Time: 02/23/23 10:30 AM  Result Value Ref Range   Vit D, 25-Hydroxy 38.3 30.0 - 100.0 ng/mL    Comment: Vitamin D deficiency has been defined by the Institute of Medicine and an Endocrine Society practice guideline as a level of serum 25-OH vitamin D less than 20 ng/mL (1,2). The Endocrine Society went on to further define vitamin D insufficiency as a level between 21 and 29 ng/mL (2). 1. IOM (Institute of Medicine). 2010. Dietary reference    intakes for calcium and D. [place]: The    [employer]. 2. Holick MF, Binkley [place], Bischoff-Ferrari HA, et al.    Evaluation, treatment, and prevention of vitamin D    deficiency: an Endocrine Society clinical practice    guideline. JCEM. 2011 Jul; 96(7):1911-30.   CMP14+EGFR     Status: Abnormal   Collection Time: 02/23/23 10:30 AM  Result Value Ref Range   Glucose 60 (L) 70 - 99 mg/dL   BUN 10 6 - 20 mg/dL   Creatinine, Ser 1.61 0.76 - 1.27 mg/dL   eGFR 096 >04 VW/UJW/1.19   BUN/Creatinine Ratio 11 9 - 20   Sodium 144 134 - 144 mmol/L   Potassium 4.1 3.5 - 5.2 mmol/L   Chloride 104 96 - 106 mmol/L   CO2 25 20 - 29 mmol/L   Calcium 10.0 8.7 - 10.2 mg/dL   Total Protein 6.7 6.0 - 8.5 g/dL   Albumin 4.7 4.3 - 5.2 g/dL   Globulin, Total 2.0 1.5 - 4.5 g/dL   Bilirubin Total 0.9 0.0 - 1.2 mg/dL   Alkaline Phosphatase 57 44 - 121 IU/L   AST 18 0 - 40 IU/L   ALT 26 0 - 44 IU/L  TSH     Status: None   Collection Time: 02/23/23 10:30 AM  Result Value Ref Range   TSH 2.990 0.450 - 4.500 uIU/mL  Hemoglobin A1c     Status: None   Collection Time: 02/23/23 10:30 AM  Result Value Ref Range   Hgb A1c MFr Bld 5.0 4.8 - 5.6 %    Comment:          Prediabetes: 5.7 - 6.4          Diabetes: >6.4          Glycemic control for adults with diabetes: <7.0    Est. average  glucose Bld gHb Est-mCnc 97 mg/dL  Vitamin J47     Status: None   Collection Time: 02/23/23 10:30 AM  Result Value Ref Range   Vitamin B-12 988 232 - 1,245 pg/mL        Assessment & Plan:    Routine Health Maintenance and Physical Exam   There is no immunization history on file for this patient.  Health Maintenance  Topic Date Due   Medicare Annual Wellness (AWV)  Never done   FOOT EXAM  Never done   OPHTHALMOLOGY EXAM  Never done   HPV VACCINES (1 - Male 3-dose series) Never done   Diabetic kidney evaluation - Urine ACR  Never done   Hepatitis C Screening  Never done   DTaP/Tdap/Td (1 - Tdap) Never done   INFLUENZA VACCINE  Never done   COVID-19 Vaccine (1 - 2023-24 season) Never done   HEMOGLOBIN A1C  08/26/2023   Diabetic kidney evaluation - eGFR measurement  02/23/2024   HIV Screening  Completed    Discussed health  benefits of physical activity, and encouraged him to engage in regular exercise appropriate for his age and condition.  Problem List Items Addressed This Visit       Active Problems   Hypothyroidism    Patient stable.  Well controlled with current therapy.   Continue current meds.       Relevant Orders   TSH (Completed)   Vitamin B12 deficiency (dietary) anemia - Primary    Checking labs today.  Will continue supplements as needed.       Relevant Orders   VITAMIN D 25 Hydroxy (Vit-D Deficiency, Fractures) (Completed)   CMP14+EGFR (Completed)   Vitamin B12 (Completed)   CBC with Differential/Platelet   Leg weakness, bilateral    See A&P for Neuromuscular disorder.      Relevant Orders   Ambulatory referral for Orthotics   Folate deficiency anemia   Relevant Orders   VITAMIN D 25 Hydroxy (Vit-D Deficiency, Fractures) (Completed)   CMP14+EGFR (Completed)   CBC with Differential/Platelet   Vitamin D deficiency    Checking labs today.  Will continue supplements as needed.       Relevant Orders   VITAMIN D 25 Hydroxy (Vit-D  Deficiency, Fractures) (Completed)   CMP14+EGFR (Completed)   CBC with Differential/Platelet   Polyneuropathy    Patient stable.  Well controlled with current therapy.   Continue current meds.       Relevant Orders   VITAMIN D 25 Hydroxy (Vit-D Deficiency, Fractures) (Completed)   CMP14+EGFR (Completed)   CBC with Differential/Platelet   Neuromuscular disorder (HCC)    Condition is improving with physical therapy.  Will refer to [facility] for Orthotics at the request of PT - he needs shoes as they are having trouble keeping his on him.   Recheck at follow up.       Relevant Orders   Ambulatory referral for Orthotics   Acne vulgaris    Sending RX for clindagel.  Caregiver informed.   Recheck at follow up.       Relevant Medications   clindamycin (CLINDAGEL) 1 % gel   Other Visit Diagnoses     Mixed hyperlipidemia       Checking labs today.  Continue current therapy for lipid control. Will modify as needed based on labwork results.   Relevant Orders   Lipid panel (Completed)   VITAMIN D 25 Hydroxy (Vit-D Deficiency, Fractures) (Completed)   CMP14+EGFR (Completed)   CBC with Differential/Platelet   Prediabetes       Relevant Orders   VITAMIN D 25 Hydroxy (Vit-D Deficiency, Fractures) (Completed)   CMP14+EGFR (Completed)   Hemoglobin A1c (Completed)   CBC with Differential/Platelet      Return in about 3 months (around 05/26/2023).     Miki Kins, FNP  02/23/2023   This document may have been prepared by [facility] Voice Recognition software and as such may include unintentional dictation errors.

## 2023-03-08 ENCOUNTER — Ambulatory Visit: Payer: Medicare HMO

## 2023-03-10 ENCOUNTER — Ambulatory Visit: Payer: Medicare HMO

## 2023-03-12 DIAGNOSIS — H6121 Impacted cerumen, right ear: Secondary | ICD-10-CM | POA: Diagnosis not present

## 2023-03-12 DIAGNOSIS — H93293 Other abnormal auditory perceptions, bilateral: Secondary | ICD-10-CM | POA: Diagnosis not present

## 2023-03-15 ENCOUNTER — Ambulatory Visit: Payer: Medicare HMO

## 2023-03-17 ENCOUNTER — Ambulatory Visit: Payer: Medicare HMO

## 2023-03-22 ENCOUNTER — Ambulatory Visit: Payer: Medicare HMO

## 2023-03-24 ENCOUNTER — Ambulatory Visit: Payer: Medicare HMO

## 2023-03-26 ENCOUNTER — Other Ambulatory Visit: Payer: Self-pay | Admitting: Family

## 2023-03-26 DIAGNOSIS — L700 Acne vulgaris: Secondary | ICD-10-CM

## 2023-03-29 ENCOUNTER — Ambulatory Visit: Payer: Medicare HMO

## 2023-03-31 ENCOUNTER — Ambulatory Visit: Payer: Medicare HMO

## 2023-04-05 ENCOUNTER — Ambulatory Visit: Payer: Medicare HMO

## 2023-04-07 ENCOUNTER — Ambulatory Visit: Payer: Medicare HMO

## 2023-04-12 ENCOUNTER — Ambulatory Visit: Payer: Medicare HMO

## 2023-04-14 ENCOUNTER — Ambulatory Visit: Payer: Medicare HMO

## 2023-04-19 ENCOUNTER — Ambulatory Visit: Payer: Medicare HMO

## 2023-04-20 ENCOUNTER — Ambulatory Visit (INDEPENDENT_AMBULATORY_CARE_PROVIDER_SITE_OTHER): Payer: Medicare HMO | Admitting: Podiatry

## 2023-04-20 VITALS — BP 124/61 | HR 96

## 2023-04-20 DIAGNOSIS — M216X2 Other acquired deformities of left foot: Secondary | ICD-10-CM | POA: Diagnosis not present

## 2023-04-20 DIAGNOSIS — M216X1 Other acquired deformities of right foot: Secondary | ICD-10-CM | POA: Diagnosis not present

## 2023-04-20 NOTE — Progress Notes (Signed)
Subjective:  Patient ID: Richard Hebert, male    DOB: 1995-11-29,  MRN: [id number]  Chief Complaint  Patient presents with   Peripheral Neuropathy    "He has Neuropathy.  We need to have his feet checked.  He won't keep shoes on his feet."    27 y.o. male presents with the above complaint.  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  Patient's caretaker states that the foot structure has been like this they wanted to get it evaluated see if there is any type of shoes that they can try.  He can keep his shoes on due to the nature of the deformity of bilateral foot.  It is very rigid in nature denies any other acute complaints.   Review of Systems: Negative except as noted in the HPI. Denies N/V/F/Ch.  Past Medical History:  Diagnosis Date   Hypokalemia 07/14/2022   Formatting of this note might be different from the original. Last Assessment & Plan:  Formatting of this note might be different from the original.  - This could certainly be contributing to his muscle weakness.  - We will aggressively replace potassium and follow.   Hyponatremia 07/26/2022   Metabolic acidosis 07/23/2022   Reactive thrombocytosis 07/23/2022   Thyroid disease     Current Outpatient Medications:    Baclofen 5 MG TABS, TAKE ONE TABLET BY MOUTH TWICE DAILY AND TAKE 1 TABLET BY MOUTH 3 TIMES A DAY AS NEEDED, Disp: 450 tablet, Rfl: 3   clindamycin (CLINDAGEL) 1 % gel, APPLY TOPICALLY TO AFFECTED AREA(S) 2 TIMES DAILY, Disp: 75 g, Rfl: 0   cyanocobalamin (VITAMIN B12) 1000 MCG tablet, Take 1 tablet (1,000 mcg total) by mouth daily., Disp: 90 tablet, Rfl: 3   folic acid (FOLVITE) 1 MG tablet, Take 1 tablet (1 mg total) by mouth daily., Disp: 90 tablet, Rfl: 3   gabapentin (NEURONTIN) 300 MG capsule, Take 1 capsule (300 mg total) by mouth at bedtime., Disp: 90 capsule, Rfl: 3   haloperidol (HALDOL) 2 MG tablet, Take 1 tablet  (2 mg total) by mouth every 6 (six) hours as needed for agitation., Disp: 360 tablet, Rfl: 3   hydrOXYzine (ATARAX) 10 MG tablet, Take 1 tablet (10 mg total) by mouth every 6 (six) hours as needed for itching or anxiety., Disp: 360 tablet, Rfl: 3   levothyroxine (SYNTHROID) 100 MCG tablet, Take 1 tablet (100 mcg total) by mouth daily before breakfast., Disp: 90 tablet, Rfl: 3   melatonin 3 MG TABS tablet, Take 1 tablet (3 mg total) by mouth at bedtime., Disp: 90 tablet, Rfl: 3   metoCLOPramide (REGLAN) 5 MG tablet, Take 1 tablet (5 mg total) by mouth 3 (three) times daily before meals., Disp: 270 tablet, Rfl: 3   metoprolol tartrate (LOPRESSOR) 25 MG tablet, Take 0.5 tablets (12.5 mg total) by mouth every 12 (twelve) hours., Disp: 90 tablet, Rfl: 3   polyethylene glycol (MIRALAX / GLYCOLAX) 17 g packet, Take 17 g by mouth daily as needed for mild constipation or moderate constipation., Disp: 100 each, Rfl: 3   potassium chloride SA (KLOR-CON M) 20 MEQ tablet, Take 2 tablets (40 mEq total) by mouth daily., Disp: 180 tablet, Rfl: 3   Prenatal Vit-Fe Fumarate-FA (MULTIVITAMIN-PRENATAL) 27-0.8 MG TABS tablet, Take 1 tablet by mouth daily at 12 noon., Disp: 100 tablet, Rfl: 2   tamsulosin (FLOMAX) 0.4 MG CAPS capsule, TAKE 1 CAPSULE BY MOUTH EVERY DAY 30  MINUTES AFTER SAME MEAL EACH DAY, Disp: 90 capsule, Rfl: 3   traZODone (DESYREL) 100 MG tablet, Take 1 tablet (100 mg total) by mouth at bedtime., Disp: 90 tablet, Rfl: 3   Zinc Oxide (DESITIN DAILY DEFENSE) 13 % CREA, Apply 1 Application topically as needed (with changes and as needed.)., Disp: 454 g, Rfl: 5  Social History   Tobacco Use  Smoking Status Never  Smokeless Tobacco Never    No Known Allergies Objective:   Vitals:   04/20/23 1339  BP: 124/61  Pulse: 96   There is no height or weight on file to calculate BMI. Constitutional Well developed. Well nourished.  Vascular Dorsalis pedis pulses palpable bilaterally. Posterior tibial  pulses palpable bilaterally. Capillary refill normal to all digits.  No cyanosis or clubbing noted. Pedal hair growth normal.  Neurologic Normal speech. Oriented to person, place, and time. Epicritic sensation to light touch grossly present bilaterally.  Dermatologic Nails well groomed and normal in appearance. No open wounds. No skin lesions.  Orthopedic: Bilateral pes cavus deformity with cavovarus foot type and rigid cavovarus foot structure.  No pain on palpation particularly.  Hammertoe contracture noted.   Radiographs: None Assessment:   1. Acquired cavovarus deformity of both feet    Plan:  Patient was evaluated and treated and all questions answered.  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  I encouraged him to change his shoes out every 3 to 6 months.  Encouraged him to wear new balance shoes.  If any foot and structure happens or if he develops any sores.  They will come and see me.  No follow-ups on file.

## 2023-04-21 ENCOUNTER — Ambulatory Visit: Payer: Medicare HMO

## 2023-05-10 ENCOUNTER — Other Ambulatory Visit: Payer: Self-pay | Admitting: Family

## 2023-05-10 DIAGNOSIS — L700 Acne vulgaris: Secondary | ICD-10-CM

## 2023-05-13 ENCOUNTER — Ambulatory Visit (INDEPENDENT_AMBULATORY_CARE_PROVIDER_SITE_OTHER): Payer: Medicare HMO | Admitting: Family

## 2023-05-13 ENCOUNTER — Encounter: Payer: Self-pay | Admitting: Family

## 2023-05-13 VITALS — BP 102/80 | HR 109

## 2023-05-13 DIAGNOSIS — Z0130 Encounter for examination of blood pressure without abnormal findings: Secondary | ICD-10-CM

## 2023-05-13 DIAGNOSIS — L02415 Cutaneous abscess of right lower limb: Secondary | ICD-10-CM | POA: Diagnosis not present

## 2023-05-18 DIAGNOSIS — G5702 Lesion of sciatic nerve, left lower limb: Secondary | ICD-10-CM | POA: Diagnosis not present

## 2023-05-18 DIAGNOSIS — G5701 Lesion of sciatic nerve, right lower limb: Secondary | ICD-10-CM | POA: Diagnosis not present

## 2023-05-18 DIAGNOSIS — G629 Polyneuropathy, unspecified: Secondary | ICD-10-CM | POA: Diagnosis not present

## 2023-05-25 ENCOUNTER — Ambulatory Visit (INDEPENDENT_AMBULATORY_CARE_PROVIDER_SITE_OTHER): Payer: Medicare HMO | Admitting: Family

## 2023-05-25 ENCOUNTER — Encounter: Payer: Self-pay | Admitting: Family

## 2023-05-25 VITALS — BP 110/70 | HR 78 | Ht 70.0 in | Wt 117.0 lb

## 2023-05-25 DIAGNOSIS — E559 Vitamin D deficiency, unspecified: Secondary | ICD-10-CM | POA: Diagnosis not present

## 2023-05-25 DIAGNOSIS — G7089 Other specified myoneural disorders: Secondary | ICD-10-CM | POA: Diagnosis not present

## 2023-05-25 DIAGNOSIS — D520 Dietary folate deficiency anemia: Secondary | ICD-10-CM | POA: Diagnosis not present

## 2023-05-25 DIAGNOSIS — E039 Hypothyroidism, unspecified: Secondary | ICD-10-CM | POA: Diagnosis not present

## 2023-05-25 DIAGNOSIS — Z23 Encounter for immunization: Secondary | ICD-10-CM | POA: Diagnosis not present

## 2023-05-25 DIAGNOSIS — G629 Polyneuropathy, unspecified: Secondary | ICD-10-CM | POA: Diagnosis not present

## 2023-05-25 DIAGNOSIS — Z1159 Encounter for screening for other viral diseases: Secondary | ICD-10-CM | POA: Diagnosis not present

## 2023-05-25 DIAGNOSIS — E782 Mixed hyperlipidemia: Secondary | ICD-10-CM | POA: Diagnosis not present

## 2023-05-25 DIAGNOSIS — R7303 Prediabetes: Secondary | ICD-10-CM

## 2023-05-25 DIAGNOSIS — D519 Vitamin B12 deficiency anemia, unspecified: Secondary | ICD-10-CM | POA: Diagnosis not present

## 2023-05-25 DIAGNOSIS — D518 Other vitamin B12 deficiency anemias: Secondary | ICD-10-CM

## 2023-05-25 DIAGNOSIS — Z0130 Encounter for examination of blood pressure without abnormal findings: Secondary | ICD-10-CM

## 2023-05-25 MED ORDER — ENSURE ENLIVE PO LIQD: 237.0000 mL | ORAL | 11 refills | Status: DC

## 2023-05-25 MED ORDER — ENSURE ENLIVE PO LIQD: 1.0000 | ORAL | 12 refills | Status: DC

## 2023-05-25 NOTE — Progress Notes (Signed)
Established Patient Office Visit  Subjective:  Patient ID: Richard Hebert, male    DOB: February 20, 1996  Age: 27 y.o. MRN: [id number]  Chief Complaint  Patient presents with   Follow-up    3 mo    Patient is here today for his 3 months follow up.  He has been feeling well since last appointment.   He does not have additional concerns to discuss today.  The spot on his thigh that was an open wound at his previous appointment has healed completely.   Labs are due today. He needs refills.   I have reviewed his active problem list, medication list, allergies, health maintenance, notes from last encounter, lab results for his appointment today.      No other concerns at this time.   Past Medical History:  Diagnosis Date   Hypokalemia 07/14/2022   Formatting of this note might be different from the original. Last Assessment & Plan:  Formatting of this note might be different from the original.  - This could certainly be contributing to his muscle weakness.  - We will aggressively replace potassium and follow.   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 No pertinent surgical history.  Social History   Socioeconomic History   Marital status: Single    Spouse name: Not on file   Number of children: Not on file   Years of education: Not on file   Highest education level: Not on file  Occupational History   Not on file  Tobacco Use   Smoking status: Never   Smokeless tobacco: Never  Substance and Sexual Activity   Alcohol use: Never   Drug use: Never   Sexual activity: Not on file  Other Topics Concern   Not on file  Social History Narrative   Not on file   Social Determinants of Health   Financial Resource Strain: Low Risk  (08/07/2022)   Received from [facility], [facility]   Overall Financial  Resource Strain (CARDIA)    Difficulty of Paying Living Expenses: Not hard at all  Food Insecurity: No Food Insecurity (08/07/2022)   Received from [facility], [facility]   Hunger Vital Sign    Worried About Running Out of Food in the Last Year: Never true    Ran Out of Food in the Last Year: Never true  Transportation Needs: No Transportation Needs (08/07/2022)   Received from [facility], [facility]   [facility] - Transportation    In the past 12 months, has lack of transportation kept you from medical appointments or from getting medications?: No    Lack of Transportation (Non-Medical): No  Physical Activity: Not on file  Stress: Not on file  Social Connections: Not on file  Intimate Partner Violence: Not At Risk (07/23/2022)   Humiliation, Afraid, Rape, and Kick questionnaire    Fear of Current or Ex-Partner: No    Emotionally Abused: No    Physically Abused: No    Sexually Abused: No    Family History  Problem Relation Age of Onset   Heart disease Maternal Aunt     No Known Allergies  Review of Systems  All other systems reviewed and are negative.      Objective:   BP 110/70   Pulse 78   Ht 5'  10" (1.778 m)   Wt 117 lb (53.1 kg)   SpO2 96%   BMI 16.79 kg/m   Vitals:   05/25/23 0929  BP: 110/70  Pulse: 78  Height: 5\' 10"  (1.778 m)  Weight: 117 lb (53.1 kg)  SpO2: 96%  BMI (Calculated): 16.79    Physical Exam Vitals and nursing note reviewed.  Constitutional:      Appearance: Normal appearance. He is normal weight.  Eyes:     Pupils: Pupils are equal, round, and reactive to light.  Cardiovascular:     Rate and Rhythm: Normal rate and regular rhythm.     Pulses: Normal pulses.     Heart sounds: Normal heart sounds.  Pulmonary:     Effort: Pulmonary effort is normal.     Breath sounds: Normal breath sounds.  Neurological:     Mental Status: He is alert.     Motor: Weakness and  abnormal muscle tone present.     Gait: Gait abnormal.     Comments: Patient with known neuromuscular disorder, causing contractures and abnormal muscle tone and gait.  Psychiatric:        Mood and Affect: Mood normal.        Behavior: Behavior normal.        Thought Content: Thought content normal.        Judgment: Judgment normal.      Results for orders placed or performed in visit on 05/25/23  Lipid panel  Result Value Ref Range   Cholesterol, Total 153 100 - 199 mg/dL   Triglycerides 161 0 - 149 mg/dL   HDL 50 >09 mg/dL   VLDL Cholesterol Cal 21 5 - 40 mg/dL   LDL Chol Calc (NIH) 82 0 - 99 mg/dL   Chol/HDL Ratio 3.1 0.0 - 5.0 ratio  VITAMIN D 25 Hydroxy (Vit-D Deficiency, Fractures)  Result Value Ref Range   Vit D, 25-Hydroxy 42.0 30.0 - 100.0 ng/mL  CMP14+EGFR  Result Value Ref Range   Glucose 77 70 - 99 mg/dL   BUN 9 6 - 20 mg/dL   Creatinine, Ser 6.04 0.76 - 1.27 mg/dL   eGFR 540 >98 JX/BJY/7.82   BUN/Creatinine Ratio 10 9 - 20   Sodium 145 (H) 134 - 144 mmol/L   Potassium 4.1 3.5 - 5.2 mmol/L   Chloride 104 96 - 106 mmol/L   CO2 26 20 - 29 mmol/L   Calcium 9.9 8.7 - 10.2 mg/dL   Total Protein 6.8 6.0 - 8.5 g/dL   Albumin 4.7 4.3 - 5.2 g/dL   Globulin, Total 2.1 1.5 - 4.5 g/dL   Bilirubin Total 1.2 0.0 - 1.2 mg/dL   Alkaline Phosphatase 53 44 - 121 IU/L   AST 17 0 - 40 IU/L   ALT 28 0 - 44 IU/L  Hemoglobin A1c  Result Value Ref Range   Hgb A1c MFr Bld 4.8 4.8 - 5.6 %   Est. average glucose Bld gHb Est-mCnc 91 mg/dL  Vitamin N56  Result Value Ref Range   Vitamin B-12 1,458 (H) 232 - 1,245 pg/mL  CBC with Diff  Result Value Ref Range   WBC 7.3 3.4 - 10.8 x10E3/uL   RBC 4.84 4.14 - 5.80 x10E6/uL   Hemoglobin 14.1 13.0 - 17.7 g/dL   Hematocrit 21.3 08.6 - 51.0 %   MCV 92 79 - 97 fL   MCH 29.1 26.6 - 33.0 pg   MCHC 31.8 31.5 - 35.7 g/dL   RDW 57.8 46.9 - 62.9 %  Platelets 207 150 - 450 x10E3/uL   Neutrophils 63 Not Estab. %   Lymphs 27 Not Estab. %    Monocytes 8 Not Estab. %   Eos 1 Not Estab. %   Basos 1 Not Estab. %   Neutrophils Absolute 4.6 1.4 - 7.0 x10E3/uL   Lymphocytes Absolute 2.0 0.7 - 3.1 x10E3/uL   Monocytes Absolute 0.6 0.1 - 0.9 x10E3/uL   EOS (ABSOLUTE) 0.1 0.0 - 0.4 x10E3/uL   Basophils Absolute 0.0 0.0 - 0.2 x10E3/uL   Immature Granulocytes 0 Not Estab. %   Immature Grans (Abs) 0.0 0.0 - 0.1 x10E3/uL  TSH+T4F+T3Free  Result Value Ref Range   TSH 5.180 (H) 0.450 - 4.500 uIU/mL   T3, Free 2.6 2.0 - 4.4 pg/mL   Free T4 1.37 0.82 - 1.77 ng/dL  Hepatitis C Ab reflex to Quant PCR  Result Value Ref Range   HCV Ab Non Reactive Non Reactive  Interpretation:  Result Value Ref Range   HCV Interp 1: Comment     Recent Results (from the past 2160 hour(s))  Lipid panel     Status: None   Collection Time: 05/25/23 10:48 AM  Result Value Ref Range   Cholesterol, Total 153 100 - 199 mg/dL   Triglycerides 409 0 - 149 mg/dL   HDL 50 >81 mg/dL   VLDL Cholesterol Cal 21 5 - 40 mg/dL   LDL Chol Calc (NIH) 82 0 - 99 mg/dL   Chol/HDL Ratio 3.1 0.0 - 5.0 ratio    Comment:                                   T. Chol/HDL Ratio                                             Men  Women                               1/2 Avg.Risk  3.4    3.3                                   Avg.Risk  5.0    4.4                                2X Avg.Risk  9.6    7.1                                3X Avg.Risk 23.4   11.0   VITAMIN D 25 Hydroxy (Vit-D Deficiency, Fractures)     Status: None   Collection Time: 05/25/23 10:48 AM  Result Value Ref Range   Vit D, 25-Hydroxy 42.0 30.0 - 100.0 ng/mL    Comment: Vitamin D deficiency has been defined by the Institute of Medicine and an Endocrine Society practice guideline as a level of serum 25-OH vitamin D less than 20 ng/mL (1,2). The Endocrine Society went on to further define vitamin D insufficiency as a level between 21 and 29 ng/mL (2). 1. IOM (Institute of Medicine). 2010. Dietary reference  intakes  for calcium and D. [place]: The    [employer]. 2. Holick MF, Binkley [place], Bischoff-Ferrari HA, et al.    Evaluation, treatment, and prevention of vitamin D    deficiency: an Endocrine Society clinical practice    guideline. JCEM. 2011 Jul; 96(7):1911-30.   CMP14+EGFR     Status: Abnormal   Collection Time: 05/25/23 10:48 AM  Result Value Ref Range   Glucose 77 70 - 99 mg/dL   BUN 9 6 - 20 mg/dL   Creatinine, Ser 9.60 0.76 - 1.27 mg/dL   eGFR 454 >09 WJ/XBJ/4.78   BUN/Creatinine Ratio 10 9 - 20   Sodium 145 (H) 134 - 144 mmol/L   Potassium 4.1 3.5 - 5.2 mmol/L   Chloride 104 96 - 106 mmol/L   CO2 26 20 - 29 mmol/L   Calcium 9.9 8.7 - 10.2 mg/dL   Total Protein 6.8 6.0 - 8.5 g/dL   Albumin 4.7 4.3 - 5.2 g/dL   Globulin, Total 2.1 1.5 - 4.5 g/dL   Bilirubin Total 1.2 0.0 - 1.2 mg/dL   Alkaline Phosphatase 53 44 - 121 IU/L   AST 17 0 - 40 IU/L   ALT 28 0 - 44 IU/L  Hemoglobin A1c     Status: None   Collection Time: 05/25/23 10:48 AM  Result Value Ref Range   Hgb A1c MFr Bld 4.8 4.8 - 5.6 %    Comment:          Prediabetes: 5.7 - 6.4          Diabetes: >6.4          Glycemic control for adults with diabetes: <7.0    Est. average glucose Bld gHb Est-mCnc 91 mg/dL  Vitamin G95     Status: Abnormal   Collection Time: 05/25/23 10:48 AM  Result Value Ref Range   Vitamin B-12 1,458 (H) 232 - 1,245 pg/mL  CBC with Diff     Status: None   Collection Time: 05/25/23 10:48 AM  Result Value Ref Range   WBC 7.3 3.4 - 10.8 x10E3/uL    Comment: **Effective May 31, 2023 profile 621308 WBC will be made**   non-orderable as a stand-alone order code.    RBC 4.84 4.14 - 5.80 x10E6/uL   Hemoglobin 14.1 13.0 - 17.7 g/dL   Hematocrit 65.7 84.6 - 51.0 %   MCV 92 79 - 97 fL   MCH 29.1 26.6 - 33.0 pg   MCHC 31.8 31.5 - 35.7 g/dL   RDW 96.2 95.2 - 84.1 %   Platelets 207 150 - 450 x10E3/uL   Neutrophils 63 Not Estab. %   Lymphs 27 Not Estab. %   Monocytes 8 Not Estab. %    Eos 1 Not Estab. %   Basos 1 Not Estab. %   Neutrophils Absolute 4.6 1.4 - 7.0 x10E3/uL   Lymphocytes Absolute 2.0 0.7 - 3.1 x10E3/uL   Monocytes Absolute 0.6 0.1 - 0.9 x10E3/uL   EOS (ABSOLUTE) 0.1 0.0 - 0.4 x10E3/uL   Basophils Absolute 0.0 0.0 - 0.2 x10E3/uL   Immature Granulocytes 0 Not Estab. %   Immature Grans (Abs) 0.0 0.0 - 0.1 x10E3/uL  TSH+T4F+T3Free     Status: Abnormal   Collection Time: 05/25/23 10:48 AM  Result Value Ref Range   TSH 5.180 (H) 0.450 - 4.500 uIU/mL   T3, Free 2.6 2.0 - 4.4 pg/mL   Free T4 1.37 0.82 - 1.77 ng/dL  Hepatitis C Ab reflex to [employer]  PCR     Status: None   Collection Time: 05/25/23 10:48 AM  Result Value Ref Range   HCV Ab Non Reactive Non Reactive  Interpretation:     Status: None   Collection Time: 05/25/23 10:48 AM  Result Value Ref Range   HCV Interp 1: Comment     Comment: Not infected with HCV unless early or acute infection is suspected (which may be delayed in an immunocompromised individual), or other evidence exists to indicate HCV infection.        Assessment & Plan:   Problem List Items Addressed This Visit       Active Problems   Hypothyroidism, unspecified    Patient stable.  Well controlled with current therapy.   Continue current meds.       Relevant Orders   CMP14+EGFR (Completed)   CBC with Diff (Completed)   TSH+T4F+T3Free (Completed)   Folate deficiency anemia   Relevant Orders   CMP14+EGFR (Completed)   CBC with Diff (Completed)   Vitamin D deficiency, unspecified    Checking labs today.  Will continue supplements as needed.       Polyneuropathy    Patient stable.  Well controlled with current therapy.   Continue current meds.       Relevant Orders   CMP14+EGFR (Completed)   CBC with Diff (Completed)   Other specified myoneural disorders (HCC)    Patient is seen by Neurology, who manage this condition.  He is well controlled with current therapy.   Will defer to them for further changes  to plan of care.       Vitamin B12 deficiency anemia, unspecified    Checking labs today.  Will continue supplements as needed.       Other Visit Diagnoses     Flu vaccine need    -  Primary   Flu vaccine given in office today.  Patient encouraged to manage symptoms as needed with supportive measures.   Relevant Orders   Influenza, MDCK, trivalent, PF(Flucelvax egg-free) (Completed)   CMP14+EGFR (Completed)   CBC with Diff (Completed)   Mixed hyperlipidemia       Checking labs today.  Continue current therapy for lipid control. Will modify as needed based on labwork results.   Relevant Orders   Lipid panel (Completed)   CMP14+EGFR (Completed)   CBC with Diff (Completed)   Need for hepatitis C screening test       Test ordered in office today. Will call with results.   Relevant Orders   CMP14+EGFR (Completed)   CBC with Diff (Completed)   Hepatitis C Ab reflex to Quant PCR (Completed)   Prediabetes       A1C is in prediabetic ranges. Patient counseled on dietary choices and verbalized understanding. Will reassess at follow up after next lab check.   Relevant Orders   CMP14+EGFR (Completed)   Hemoglobin A1c (Completed)   Vitamin B12 (Completed)   CBC with Diff (Completed)       Return in about 4 months (around 09/22/2023) for F/U.   Total time spent: 20 minutes  Miki Kins, FNP  05/25/2023   This document may have been prepared by [facility] Voice Recognition software and as such may include unintentional dictation errors.

## 2023-05-26 LAB — CMP14+EGFR
ALT: 28 [IU]/L (ref 0–44)
AST: 17 [IU]/L (ref 0–40)
Albumin: 4.7 g/dL (ref 4.3–5.2)
Alkaline Phosphatase: 53 [IU]/L (ref 44–121)
BUN/Creatinine Ratio: 10 (ref 9–20)
BUN: 9 mg/dL (ref 6–20)
Bilirubin Total: 1.2 mg/dL (ref 0.0–1.2)
CO2: 26 mmol/L (ref 20–29)
Calcium: 9.9 mg/dL (ref 8.7–10.2)
Chloride: 104 mmol/L (ref 96–106)
Creatinine, Ser: 0.93 mg/dL (ref 0.76–1.27)
Globulin, Total: 2.1 g/dL (ref 1.5–4.5)
Glucose: 77 mg/dL (ref 70–99)
Potassium: 4.1 mmol/L (ref 3.5–5.2)
Sodium: 145 mmol/L — ABNORMAL HIGH (ref 134–144)
Total Protein: 6.8 g/dL (ref 6.0–8.5)
eGFR: 117 mL/min/{1.73_m2} (ref >59)

## 2023-05-26 LAB — CBC WITH DIFFERENTIAL/PLATELET
Basophils Absolute: 0 10*3/uL (ref 0.0–0.2)
Basos: 1 %
EOS (ABSOLUTE): 0.1 10*3/uL (ref 0.0–0.4)
Eos: 1 %
Hematocrit: 44.4 % (ref 37.5–51.0)
Hemoglobin: 14.1 g/dL (ref 13.0–17.7)
Immature Grans (Abs): 0 10*3/uL (ref 0.0–0.1)
Immature Granulocytes: 0 %
Lymphocytes Absolute: 2 10*3/uL (ref 0.7–3.1)
Lymphs: 27 %
MCH: 29.1 pg (ref 26.6–33.0)
MCHC: 31.8 g/dL (ref 31.5–35.7)
MCV: 92 fL (ref 79–97)
Monocytes Absolute: 0.6 10*3/uL (ref 0.1–0.9)
Monocytes: 8 %
Neutrophils Absolute: 4.6 10*3/uL (ref 1.4–7.0)
Neutrophils: 63 %
Platelets: 207 10*3/uL (ref 150–450)
RBC: 4.84 x10E6/uL (ref 4.14–5.80)
RDW: 12.4 % (ref 11.6–15.4)
WBC: 7.3 10*3/uL (ref 3.4–10.8)

## 2023-05-26 LAB — LIPID PANEL
Chol/HDL Ratio: 3.1 {ratio} (ref 0.0–5.0)
Cholesterol, Total: 153 mg/dL (ref 100–199)
HDL: 50 mg/dL (ref >39)
LDL Chol Calc (NIH): 82 mg/dL (ref 0–99)
Triglycerides: 116 mg/dL (ref 0–149)
VLDL Cholesterol Cal: 21 mg/dL (ref 5–40)

## 2023-05-26 LAB — TSH+T4F+T3FREE
Free T4: 1.37 ng/dL (ref 0.82–1.77)
T3, Free: 2.6 pg/mL (ref 2.0–4.4)
TSH: 5.18 u[IU]/mL — ABNORMAL HIGH (ref 0.450–4.500)

## 2023-05-26 LAB — HEMOGLOBIN A1C
Est. average glucose Bld gHb Est-mCnc: 91 mg/dL
Hgb A1c MFr Bld: 4.8 % (ref 4.8–5.6)

## 2023-05-26 LAB — HCV AB W REFLEX TO QUANT PCR: HCV Ab: NONREACTIVE

## 2023-05-26 LAB — VITAMIN D 25 HYDROXY (VIT D DEFICIENCY, FRACTURES): Vit D, 25-Hydroxy: 42 ng/mL (ref 30.0–100.0)

## 2023-05-26 LAB — VITAMIN B12: Vitamin B-12: 1458 pg/mL — ABNORMAL HIGH (ref 232–1245)

## 2023-05-26 LAB — HCV INTERPRETATION

## 2023-05-30 ENCOUNTER — Encounter: Payer: Self-pay | Admitting: Family

## 2023-05-30 NOTE — Assessment & Plan Note (Signed)
Patient stable.  Well controlled with current therapy.   Continue current meds.  

## 2023-05-30 NOTE — Assessment & Plan Note (Signed)
Checking labs today.  Will continue supplements as needed.  

## 2023-05-30 NOTE — Assessment & Plan Note (Signed)
Patient is seen by Neurology, who manage this condition.  He is well controlled with current therapy.   Will defer to them for further changes to plan of care.

## 2023-06-10 DIAGNOSIS — F840 Autistic disorder: Secondary | ICD-10-CM | POA: Diagnosis not present

## 2023-06-10 DIAGNOSIS — G629 Polyneuropathy, unspecified: Secondary | ICD-10-CM | POA: Diagnosis not present

## 2023-06-10 DIAGNOSIS — R531 Weakness: Secondary | ICD-10-CM | POA: Diagnosis not present

## 2023-06-17 DIAGNOSIS — F73 Profound intellectual disabilities: Secondary | ICD-10-CM | POA: Insufficient documentation

## 2023-06-27 ENCOUNTER — Encounter: Payer: Self-pay | Admitting: Family

## 2023-06-27 NOTE — Progress Notes (Signed)
Acute Office Visit  Subjective:     Patient ID: Richard Hebert, male    DOB: 1995-07-03, 27 y.o.   MRN: [id number]  Patient is in today for  Chief Complaint  Patient presents with   Acute Visit    Knot on thigh    Patient is here today for a lump/bump on his thigh.  One of the caregivers noticed a bump on the back of his thigh, right at the base of his buttocks.  They have been treating this at the home, and it is healing well.   He is feeling well otherwise.       Review of Systems  Skin:        Bump on back of right thigh.  All other systems reviewed and are negative.       Objective:    BP 102/80   Pulse (!) 109   SpO2 98%   Physical Exam Vitals and nursing note reviewed.  Constitutional:      Appearance: Normal appearance. He is normal weight.  HENT:     Head: Normocephalic.  Eyes:     Extraocular Movements: Extraocular movements intact.     Conjunctiva/sclera: Conjunctivae normal.     Pupils: Pupils are equal, round, and reactive to light.  Cardiovascular:     Rate and Rhythm: Normal rate and regular rhythm.     Pulses: Normal pulses.     Heart sounds: Normal heart sounds.  Pulmonary:     Effort: Pulmonary effort is normal.     Breath sounds: Normal breath sounds.  Skin:    Findings: Wound present.       Neurological:     Mental Status: He is alert.     Sensory: Sensory deficit present.     Gait: Gait abnormal.  Psychiatric:        Mood and Affect: Mood normal.        Behavior: Behavior normal.        Thought Content: Thought content normal.        Judgment: Judgment normal.     No results found for any visits on 05/13/23.  Recent Results (from the past 2160 hours)  Lipid panel     Status: None   Collection Time: 05/25/23 10:48 AM  Result Value Ref Range   Cholesterol, Total 153 100 - 199 mg/dL   Triglycerides 409 0 - 149 mg/dL   HDL 50 >81 mg/dL   VLDL Cholesterol Cal 21 5 - 40 mg/dL   LDL Chol Calc (NIH) 82 0 - 99 mg/dL    Chol/HDL Ratio 3.1 0.0 - 5.0 ratio    Comment:                                   T. Chol/HDL Ratio                                             Men  Women                               1/2 Avg.Risk  3.4    3.3  Avg.Risk  5.0    4.4                                2X Avg.Risk  9.6    7.1                                3X Avg.Risk 23.4   11.0   VITAMIN D 25 Hydroxy (Vit-D Deficiency, Fractures)     Status: None   Collection Time: 05/25/23 10:48 AM  Result Value Ref Range   Vit D, 25-Hydroxy 42.0 30.0 - 100.0 ng/mL    Comment: Vitamin D deficiency has been defined by the Institute of Medicine and an Endocrine Society practice guideline as a level of serum 25-OH vitamin D less than 20 ng/mL (1,2). The Endocrine Society went on to further define vitamin D insufficiency as a level between 21 and 29 ng/mL (2). 1. IOM (Institute of Medicine). 2010. Dietary reference    intakes for calcium and D. [place]: The    [employer]. 2. Holick MF, Binkley [place], Bischoff-Ferrari HA, et al.    Evaluation, treatment, and prevention of vitamin D    deficiency: an Endocrine Society clinical practice    guideline. JCEM. 2011 Jul; 96(7):1911-30.   CMP14+EGFR     Status: Abnormal   Collection Time: 05/25/23 10:48 AM  Result Value Ref Range   Glucose 77 70 - 99 mg/dL   BUN 9 6 - 20 mg/dL   Creatinine, Ser 6.57 0.76 - 1.27 mg/dL   eGFR 846 >96 EX/BMW/4.13   BUN/Creatinine Ratio 10 9 - 20   Sodium 145 (H) 134 - 144 mmol/L   Potassium 4.1 3.5 - 5.2 mmol/L   Chloride 104 96 - 106 mmol/L   CO2 26 20 - 29 mmol/L   Calcium 9.9 8.7 - 10.2 mg/dL   Total Protein 6.8 6.0 - 8.5 g/dL   Albumin 4.7 4.3 - 5.2 g/dL   Globulin, Total 2.1 1.5 - 4.5 g/dL   Bilirubin Total 1.2 0.0 - 1.2 mg/dL   Alkaline Phosphatase 53 44 - 121 IU/L   AST 17 0 - 40 IU/L   ALT 28 0 - 44 IU/L  Hemoglobin A1c     Status: None   Collection Time: 05/25/23 10:48 AM  Result Value Ref Range    Hgb A1c MFr Bld 4.8 4.8 - 5.6 %    Comment:          Prediabetes: 5.7 - 6.4          Diabetes: >6.4          Glycemic control for adults with diabetes: <7.0    Est. average glucose Bld gHb Est-mCnc 91 mg/dL  Vitamin K44     Status: Abnormal   Collection Time: 05/25/23 10:48 AM  Result Value Ref Range   Vitamin B-12 1,458 (H) 232 - 1,245 pg/mL  CBC with Diff     Status: None   Collection Time: 05/25/23 10:48 AM  Result Value Ref Range   WBC 7.3 3.4 - 10.8 x10E3/uL    Comment: **Effective May 31, 2023 profile [id number] WBC will be made**   non-orderable as a stand-alone order code.    RBC 4.84 4.14 - 5.80 x10E6/uL   Hemoglobin 14.1 13.0 - 17.7 g/dL   Hematocrit 53.6 64.4 - 51.0 %   MCV 92 79 - 97 fL  MCH 29.1 26.6 - 33.0 pg   MCHC 31.8 31.5 - 35.7 g/dL   RDW 78.2 95.6 - 21.3 %   Platelets 207 150 - 450 x10E3/uL   Neutrophils 63 Not Estab. %   Lymphs 27 Not Estab. %   Monocytes 8 Not Estab. %   Eos 1 Not Estab. %   Basos 1 Not Estab. %   Neutrophils Absolute 4.6 1.4 - 7.0 x10E3/uL   Lymphocytes Absolute 2.0 0.7 - 3.1 x10E3/uL   Monocytes Absolute 0.6 0.1 - 0.9 x10E3/uL   EOS (ABSOLUTE) 0.1 0.0 - 0.4 x10E3/uL   Basophils Absolute 0.0 0.0 - 0.2 x10E3/uL   Immature Granulocytes 0 Not Estab. %   Immature Grans (Abs) 0.0 0.0 - 0.1 x10E3/uL  TSH+T4F+T3Free     Status: Abnormal   Collection Time: 05/25/23 10:48 AM  Result Value Ref Range   TSH 5.180 (H) 0.450 - 4.500 uIU/mL   T3, Free 2.6 2.0 - 4.4 pg/mL   Free T4 1.37 0.82 - 1.77 ng/dL  Hepatitis C Ab reflex to Quant PCR     Status: None   Collection Time: 05/25/23 10:48 AM  Result Value Ref Range   HCV Ab Non Reactive Non Reactive  Interpretation:     Status: None   Collection Time: 05/25/23 10:48 AM  Result Value Ref Range   HCV Interp 1: Comment     Comment: Not infected with HCV unless early or acute infection is suspected (which may be delayed in an immunocompromised individual), or other evidence exists to indicate  HCV infection.     Allergies as of 05/13/2023   No Known Allergies      Medication List        Accurate as of May 13, 2023 11:59 PM. If you have any questions, ask your nurse or doctor.          Baclofen 5 MG Tabs TAKE ONE TABLET BY MOUTH TWICE DAILY AND TAKE 1 TABLET BY MOUTH 3 TIMES A DAY AS NEEDED   clindamycin 1 % gel Commonly known as: CLINDAGEL APPLY TOPICALLY TO AFFECTED AREA(S) 2 TIMES DAILY   cyanocobalamin 1000 MCG tablet Commonly known as: VITAMIN B12 Take 1 tablet (1,000 mcg total) by mouth daily.   Desitin Daily Defense 13 % Crea Generic drug: Zinc Oxide Apply 1 Application topically as needed (with changes and as needed.).   folic acid 1 MG tablet Commonly known as: FOLVITE Take 1 tablet (1 mg total) by mouth daily.   gabapentin 300 MG capsule Commonly known as: NEURONTIN Take 1 capsule (300 mg total) by mouth at bedtime.   haloperidol 2 MG tablet Commonly known as: HALDOL Take 1 tablet (2 mg total) by mouth every 6 (six) hours as needed for agitation.   hydrOXYzine 10 MG tablet Commonly known as: ATARAX Take 1 tablet (10 mg total) by mouth every 6 (six) hours as needed for itching or anxiety.   levothyroxine 100 MCG tablet Commonly known as: SYNTHROID Take 1 tablet (100 mcg total) by mouth daily before breakfast.   melatonin 3 MG Tabs tablet Take 1 tablet (3 mg total) by mouth at bedtime.   metoCLOPramide 5 MG tablet Commonly known as: REGLAN Take 1 tablet (5 mg total) by mouth 3 (three) times daily before meals.   metoprolol tartrate 25 MG tablet Commonly known as: LOPRESSOR Take 0.5 tablets (12.5 mg total) by mouth every 12 (twelve) hours.   multivitamin-prenatal 27-0.8 MG Tabs tablet Take 1 tablet by mouth daily at 12  noon.   polyethylene glycol 17 g packet Commonly known as: MIRALAX / GLYCOLAX Take 17 g by mouth daily as needed for mild constipation or moderate constipation.   potassium chloride SA 20 MEQ tablet Commonly  known as: KLOR-CON M Take 2 tablets (40 mEq total) by mouth daily.   tamsulosin 0.4 MG Caps capsule Commonly known as: FLOMAX TAKE 1 CAPSULE BY MOUTH EVERY DAY 30 MINUTES AFTER SAME MEAL EACH DAY   traZODone 100 MG tablet Commonly known as: DESYREL Take 1 tablet (100 mg total) by mouth at bedtime.            Assessment & Plan:   Problem List Items Addressed This Visit   None Visit Diagnoses       Abscess of right thigh    -  Primary   Continue current care plan, as patient is healing well. Home is to notify if not continuing to improve.        Return as previously scheduled.  Total time spent: 20 minutes  Miki Kins, FNP  05/13/2023   This document may have been prepared by [facility] Voice Recognition software and as such may include unintentional dictation errors.

## 2023-07-02 DIAGNOSIS — G629 Polyneuropathy, unspecified: Secondary | ICD-10-CM | POA: Diagnosis not present

## 2023-07-05 DIAGNOSIS — G629 Polyneuropathy, unspecified: Secondary | ICD-10-CM | POA: Diagnosis not present

## 2023-07-12 ENCOUNTER — Ambulatory Visit: Payer: Medicare HMO | Admitting: Family

## 2023-07-20 ENCOUNTER — Ambulatory Visit: Payer: Medicare HMO | Admitting: Family

## 2023-07-20 ENCOUNTER — Encounter: Payer: Self-pay | Admitting: Family

## 2023-07-20 VITALS — BP 138/68 | HR 73 | Ht 70.0 in | Wt 127.0 lb

## 2023-07-20 DIAGNOSIS — S8001XA Contusion of right knee, initial encounter: Secondary | ICD-10-CM

## 2023-07-20 DIAGNOSIS — Z0130 Encounter for examination of blood pressure without abnormal findings: Secondary | ICD-10-CM

## 2023-07-20 DIAGNOSIS — W050XXA Fall from non-moving wheelchair, initial encounter: Secondary | ICD-10-CM

## 2023-07-20 NOTE — Progress Notes (Signed)
Established Patient Office Visit  Subjective:  Patient ID: Richard Hebert, male    DOB: 26-Aug-1995  Age: 28 y.o. MRN: [id number]  Chief Complaint  Patient presents with   Fall    Happened about 3 weeks ago, no complaints of any pain.    Patient was at his day program when he fell while trying to go from his wheelchair to a recliner.  He landed on the hard floor, but popped right back up.   Caregiver says that he has not complained of any pain, has not acted as though he were in pain and has been fine since.  They only found out because someone else told them at the group home last week.   Fall The accident occurred More than 1 week ago. The fall occurred while standing. He fell from a height of 3 to 5 ft. He landed on Hard floor. There was no blood loss. The point of impact was the right knee. Pain location: no pain. The patient is experiencing no pain. He has tried nothing for the symptoms.    No other concerns at this time.   Past Medical History:  Diagnosis Date   Hypokalemia 07/14/2022   Formatting of this note might be different from the original. Last Assessment & Plan:  Formatting of this note might be different from the original.  - This could certainly be contributing to his muscle weakness.  - We will aggressively replace potassium and follow.   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 No pertinent surgical history.  Social History   Socioeconomic History   Marital status: Single    Spouse name: Not on file   Number of children: Not on file   Years of education: Not on file   Highest education level: Not on file  Occupational History   Not on file  Tobacco Use   Smoking status: Never   Smokeless tobacco: Never  Substance and Sexual Activity   Alcohol use: Never   Drug use: Never   Sexual activity: Not on file  Other  Topics Concern   Not on file  Social History Narrative   Not on file   Social Drivers of Health   Financial Resource Strain: Low Risk  (08/07/2022)   Received from [facility], [facility]   Overall Financial Resource Strain (CARDIA)    Difficulty of Paying Living Expenses: Not hard at all  Food Insecurity: No Food Insecurity (08/07/2022)   Received from [facility], [facility]   Hunger Vital Sign    Worried About Running Out of Food in the Last Year: Never true    Ran Out of Food in the Last Year: Never true  Transportation Needs: No Transportation Needs (08/07/2022)   Received from [facility], [facility]   [facility] - Transportation    In the past 12 months, has lack of transportation kept you from medical appointments or from getting medications?: No    Lack of Transportation (Non-Medical): No  Physical Activity: Not on file  Stress: Not on file  Social Connections: Not on file  Intimate Partner Violence: Not At Risk (07/23/2022)   Humiliation, Afraid, Rape, and Kick questionnaire    Fear of Current or Ex-Partner: No    Emotionally Abused: No    Physically Abused: No  Sexually Abused: No    Family History  Problem Relation Age of Onset   Heart disease Maternal Aunt     No Known Allergies  Review of Systems  All other systems reviewed and are negative.      Objective:   BP 138/68   Pulse 73   Ht 5\' 10"  (1.778 m)   Wt 127 lb (57.6 kg)   SpO2 99%   BMI 18.22 kg/m   Vitals:   07/20/23 0937  BP: 138/68  Pulse: 73  Height: 5\' 10"  (1.778 m)  Weight: 127 lb (57.6 kg)  SpO2: 99%  BMI (Calculated): 18.22    Physical Exam Vitals and nursing note reviewed.  Constitutional:      Appearance: Normal appearance. He is normal weight.  Eyes:     Pupils: Pupils are equal, round, and reactive to light.  Cardiovascular:     Rate and Rhythm: Normal rate and  regular rhythm.     Pulses: Normal pulses.     Heart sounds: Normal heart sounds.  Pulmonary:     Effort: Pulmonary effort is normal.     Breath sounds: Normal breath sounds.  Neurological:     Mental Status: He is alert. Mental status is at baseline.     Gait: Gait abnormal.  Psychiatric:        Mood and Affect: Mood normal.        Thought Content: Thought content normal.        Judgment: Judgment normal.      No results found for any visits on 07/20/23.  Recent Results (from the past 2160 hours)  Lipid panel     Status: None   Collection Time: 05/25/23 10:48 AM  Result Value Ref Range   Cholesterol, Total 153 100 - 199 mg/dL   Triglycerides 664 0 - 149 mg/dL   HDL 50 >40 mg/dL   VLDL Cholesterol Cal 21 5 - 40 mg/dL   LDL Chol Calc (NIH) 82 0 - 99 mg/dL   Chol/HDL Ratio 3.1 0.0 - 5.0 ratio    Comment:                                   T. Chol/HDL Ratio                                             Men  Women                               1/2 Avg.Risk  3.4    3.3                                   Avg.Risk  5.0    4.4                                2X Avg.Risk  9.6    7.1                                3X Avg.Risk 23.4   11.0   VITAMIN D 25  Hydroxy (Vit-D Deficiency, Fractures)     Status: None   Collection Time: 05/25/23 10:48 AM  Result Value Ref Range   Vit D, 25-Hydroxy 42.0 30.0 - 100.0 ng/mL    Comment: Vitamin D deficiency has been defined by the Institute of Medicine and an Endocrine Society practice guideline as a level of serum 25-OH vitamin D less than 20 ng/mL (1,2). The Endocrine Society went on to further define vitamin D insufficiency as a level between 21 and 29 ng/mL (2). 1. IOM (Institute of Medicine). 2010. Dietary reference    intakes for calcium and D. [place]: The    [employer]. 2. Holick MF, [place], Bischoff-Ferrari HA, et al.    Evaluation, treatment, and prevention of vitamin D    deficiency: an Endocrine Society clinical  practice    guideline. JCEM. 2011 Jul; 96(7):1911-30.   CMP14+EGFR     Status: Abnormal   Collection Time: 05/25/23 10:48 AM  Result Value Ref Range   Glucose 77 70 - 99 mg/dL   BUN 9 6 - 20 mg/dL   Creatinine, Ser 4.78 0.76 - 1.27 mg/dL   eGFR 295 >62 ZH/YQM/5.78   BUN/Creatinine Ratio 10 9 - 20   Sodium 145 (H) 134 - 144 mmol/L   Potassium 4.1 3.5 - 5.2 mmol/L   Chloride 104 96 - 106 mmol/L   CO2 26 20 - 29 mmol/L   Calcium 9.9 8.7 - 10.2 mg/dL   Total Protein 6.8 6.0 - 8.5 g/dL   Albumin 4.7 4.3 - 5.2 g/dL   Globulin, Total 2.1 1.5 - 4.5 g/dL   Bilirubin Total 1.2 0.0 - 1.2 mg/dL   Alkaline Phosphatase 53 44 - 121 IU/L   AST 17 0 - 40 IU/L   ALT 28 0 - 44 IU/L  Hemoglobin A1c     Status: None   Collection Time: 05/25/23 10:48 AM  Result Value Ref Range   Hgb A1c MFr Bld 4.8 4.8 - 5.6 %    Comment:          Prediabetes: 5.7 - 6.4          Diabetes: >6.4          Glycemic control for adults with diabetes: <7.0    Est. average glucose Bld gHb Est-mCnc 91 mg/dL  Vitamin I69     Status: Abnormal   Collection Time: 05/25/23 10:48 AM  Result Value Ref Range   Vitamin B-12 1,458 (H) 232 - 1,245 pg/mL  CBC with Diff     Status: None   Collection Time: 05/25/23 10:48 AM  Result Value Ref Range   WBC 7.3 3.4 - 10.8 x10E3/uL    Comment: **Effective May 31, 2023 profile 629528 WBC will be made**   non-orderable as a stand-alone order code.    RBC 4.84 4.14 - 5.80 x10E6/uL   Hemoglobin 14.1 13.0 - 17.7 g/dL   Hematocrit 41.3 24.4 - 51.0 %   MCV 92 79 - 97 fL   MCH 29.1 26.6 - 33.0 pg   MCHC 31.8 31.5 - 35.7 g/dL   RDW 01.0 27.2 - 53.6 %   Platelets 207 150 - 450 x10E3/uL   Neutrophils 63 Not Estab. %   Lymphs 27 Not Estab. %   Monocytes 8 Not Estab. %   Eos 1 Not Estab. %   Basos 1 Not Estab. %   Neutrophils Absolute 4.6 1.4 - 7.0 x10E3/uL   Lymphocytes Absolute 2.0 0.7 - 3.1 x10E3/uL   Monocytes  Absolute 0.6 0.1 - 0.9 x10E3/uL   EOS (ABSOLUTE) 0.1 0.0 - 0.4  x10E3/uL   Basophils Absolute 0.0 0.0 - 0.2 x10E3/uL   Immature Granulocytes 0 Not Estab. %   Immature Grans (Abs) 0.0 0.0 - 0.1 x10E3/uL  TSH+T4F+T3Free     Status: Abnormal   Collection Time: 05/25/23 10:48 AM  Result Value Ref Range   TSH 5.180 (H) 0.450 - 4.500 uIU/mL   T3, Free 2.6 2.0 - 4.4 pg/mL   Free T4 1.37 0.82 - 1.77 ng/dL  Hepatitis C Ab reflex to Quant PCR     Status: None   Collection Time: 05/25/23 10:48 AM  Result Value Ref Range   HCV Ab Non Reactive Non Reactive  Interpretation:     Status: None   Collection Time: 05/25/23 10:48 AM  Result Value Ref Range   HCV Interp 1: Comment     Comment: Not infected with HCV unless early or acute infection is suspected (which may be delayed in an immunocompromised individual), or other evidence exists to indicate HCV infection.        Assessment & Plan:   Problem List Items Addressed This Visit   None Visit Diagnoses       Accidental fall from wheelchair, initial encounter    -  Primary   do not believe there are any continued side effects from fall.  They will let me know if there are any unforseen consequences going forward.     Contusion of right knee, initial encounter           Return as previously scheduled.   Total time spent: 20 minutes  Miki Kins, FNP  07/20/2023   This document may have been prepared by [facility] Voice Recognition software and as such may include unintentional dictation errors.

## 2023-08-02 DIAGNOSIS — G629 Polyneuropathy, unspecified: Secondary | ICD-10-CM | POA: Diagnosis not present

## 2023-08-05 DIAGNOSIS — G629 Polyneuropathy, unspecified: Secondary | ICD-10-CM | POA: Diagnosis not present

## 2023-08-09 DIAGNOSIS — L600 Ingrowing nail: Secondary | ICD-10-CM | POA: Diagnosis not present

## 2023-08-09 DIAGNOSIS — L03032 Cellulitis of left toe: Secondary | ICD-10-CM | POA: Diagnosis not present

## 2023-08-11 DIAGNOSIS — Z03818 Encounter for observation for suspected exposure to other biological agents ruled out: Secondary | ICD-10-CM | POA: Diagnosis not present

## 2023-08-11 DIAGNOSIS — L0100 Impetigo, unspecified: Secondary | ICD-10-CM | POA: Diagnosis not present

## 2023-08-11 DIAGNOSIS — R051 Acute cough: Secondary | ICD-10-CM | POA: Diagnosis not present

## 2023-08-12 ENCOUNTER — Ambulatory Visit: Payer: Medicare HMO | Admitting: Cardiology

## 2023-08-12 ENCOUNTER — Encounter: Payer: Self-pay | Admitting: Cardiology

## 2023-08-12 VITALS — BP 108/62 | HR 86 | Ht 70.0 in | Wt 127.0 lb

## 2023-08-12 DIAGNOSIS — L0100 Impetigo, unspecified: Secondary | ICD-10-CM | POA: Diagnosis not present

## 2023-08-12 DIAGNOSIS — R0981 Nasal congestion: Secondary | ICD-10-CM

## 2023-08-12 DIAGNOSIS — R051 Acute cough: Secondary | ICD-10-CM

## 2023-08-12 DIAGNOSIS — Z0130 Encounter for examination of blood pressure without abnormal findings: Secondary | ICD-10-CM

## 2023-08-12 MED ORDER — CEPHALEXIN 500 MG PO CAPS: 500.0000 mg | ORAL_CAPSULE | Freq: Two times a day (BID) | ORAL | 0 refills | Status: AC

## 2023-08-12 NOTE — Progress Notes (Signed)
 Established Patient Office Visit  Subjective:  Patient ID: Richard Hebert, male    DOB: 1995/10/26  Age: 28 y.o. MRN: [id number]  Chief Complaint  Patient presents with   Acute Visit    Congested, Rash Around Mouth    Patient in office for an acute visit. Patient accompanied by his caregiver today. Complaining of feeling congested and a rash around his mouth. Patient seen at [facility] walk in clinic yesterday with complaints of  x2-3 days nasal congestion, cough. Denies fevers and chills, nausea, emesis, diarrhea, flushed, weakness Skin around nose and mouth is irritated and red (maybe from shaving?) has been rubbing at face and nose with drainage and coughing. Patient chest xray negative at that time. Patient was prescribed Bactroban for rash on face, Flonase and Mucinex for congestion.  Patient presents today with similar complaints, has not picked up medications prescribed at [facility] yesterday. Will send in Keflex for rash on face.     No other concerns at this time.   Past Medical History:  Diagnosis Date   Hypokalemia 07/14/2022   Formatting of this note might be different from the original. Last Assessment & Plan:  Formatting of this note might be different from the original.  - This could certainly be contributing to his muscle weakness.  - We will aggressively replace potassium and follow.   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 No pertinent surgical history.  Social History   Socioeconomic History   Marital status: Single    Spouse name: Not on file   Number of children: Not on file   Years of education: Not on file   Highest education level: Not on file  Occupational History   Not on file  Tobacco Use   Smoking status: Never   Smokeless tobacco: Never  Substance and Sexual Activity   Alcohol use: Never   Drug use: Never    Sexual activity: Not on file  Other Topics Concern   Not on file  Social History Narrative   Not on file   Social Drivers of Health   Financial Resource Strain: Low Risk  (08/07/2022)   Received from [facility], [facility]   Overall Financial Resource Strain (CARDIA)    Difficulty of Paying Living Expenses: Not hard at all  Food Insecurity: No Food Insecurity (08/07/2022)   Received from [facility], [facility]   Hunger Vital Sign    Worried About Running Out of Food in the Last Year: Never true    Ran Out of Food in the Last Year: Never true  Transportation Needs: No Transportation Needs (08/07/2022)   Received from [facility], [facility]   [facility] - Transportation    In the past 12 months, has lack of transportation kept you from medical appointments or from getting medications?: No    Lack of Transportation (Non-Medical): No  Physical Activity: Not on file  Stress: Not on file  Social Connections: Not on file  Intimate Partner Violence: Not At Risk (07/23/2022)   Humiliation, Afraid, Rape, and Kick questionnaire    Fear of Current or Ex-Partner: No    Emotionally Abused: No    Physically Abused: No    Sexually Abused: No    Family History  Problem Relation Age of Onset   Heart disease Maternal Aunt  No Known Allergies  Outpatient Medications Prior to Visit  Medication Sig   Baclofen 5 MG TABS TAKE ONE TABLET BY MOUTH TWICE DAILY AND TAKE 1 TABLET BY MOUTH 3 TIMES A DAY AS NEEDED   clindamycin (CLINDAGEL) 1 % gel APPLY TOPICALLY TO AFFECTED AREA(S) 2 TIMES DAILY   cyanocobalamin (VITAMIN B12) 1000 MCG tablet Take 1 tablet (1,000 mcg total) by mouth daily.   feeding supplement (ENSURE ENLIVE / ENSURE PLUS) LIQD Take 237 mLs by mouth daily.   folic acid (FOLVITE) 1 MG tablet Take 1 tablet (1 mg total) by mouth daily.   gabapentin (NEURONTIN) 300 MG capsule Take 1  capsule (300 mg total) by mouth at bedtime.   haloperidol (HALDOL) 2 MG tablet Take 1 tablet (2 mg total) by mouth every 6 (six) hours as needed for agitation.   hydrOXYzine (ATARAX) 10 MG tablet Take 1 tablet (10 mg total) by mouth every 6 (six) hours as needed for itching or anxiety.   levothyroxine (SYNTHROID) 100 MCG tablet Take 1 tablet (100 mcg total) by mouth daily before breakfast.   melatonin 3 MG TABS tablet Take 1 tablet (3 mg total) by mouth at bedtime.   metoCLOPramide (REGLAN) 5 MG tablet Take 1 tablet (5 mg total) by mouth 3 (three) times daily before meals.   metoprolol tartrate (LOPRESSOR) 25 MG tablet Take 0.5 tablets (12.5 mg total) by mouth every 12 (twelve) hours.   polyethylene glycol (MIRALAX / GLYCOLAX) 17 g packet Take 17 g by mouth daily as needed for mild constipation or moderate constipation.   potassium chloride SA (KLOR-CON M) 20 MEQ tablet Take 2 tablets (40 mEq total) by mouth daily.   Prenatal Vit-Fe Fumarate-FA (MULTIVITAMIN-PRENATAL) 27-0.8 MG TABS tablet Take 1 tablet by mouth daily at 12 noon.   tamsulosin (FLOMAX) 0.4 MG CAPS capsule TAKE 1 CAPSULE BY MOUTH EVERY DAY 30 MINUTES AFTER SAME MEAL EACH DAY   traZODone (DESYREL) 100 MG tablet Take 1 tablet (100 mg total) by mouth at bedtime.   Zinc Oxide (DESITIN DAILY DEFENSE) 13 % CREA Apply 1 Application topically as needed (with changes and as needed.).   No facility-administered medications prior to visit.    Review of Systems  Constitutional: Negative.   Eyes: Negative.   Respiratory: Negative.    Cardiovascular: Negative.  Negative for chest pain.  Gastrointestinal: Negative.  Negative for abdominal pain and constipation.  Genitourinary: Negative.   Musculoskeletal:  Negative for myalgias.  Neurological: Negative.  Negative for dizziness and headaches.  Endo/Heme/Allergies: Negative.   All other systems reviewed and are negative.      Objective:   BP 108/62   Pulse 86   Ht 5\' 10"  (1.778 m)    Wt 127 lb (57.6 kg)   SpO2 97%   BMI 18.22 kg/m   Vitals:   08/12/23 1042  BP: 108/62  Pulse: 86  Height: 5\' 10"  (1.778 m)  Weight: 127 lb (57.6 kg)  SpO2: 97%  BMI (Calculated): 18.22    Physical Exam Nursing note reviewed.  Constitutional:      Appearance: Normal appearance. He is normal weight.  HENT:     Head: Normocephalic and atraumatic.      Nose: Nose normal.     Mouth/Throat:     Mouth: Mucous membranes are moist.     Pharynx: Oropharynx is clear.  Eyes:     Extraocular Movements: Extraocular movements intact.     Conjunctiva/sclera: Conjunctivae normal.     Pupils: Pupils are equal,  round, and reactive to light.  Cardiovascular:     Rate and Rhythm: Normal rate and regular rhythm.     Pulses: Normal pulses.     Heart sounds: Normal heart sounds.  Pulmonary:     Effort: Pulmonary effort is normal.     Breath sounds: Normal breath sounds.  Abdominal:     General: Abdomen is flat. Bowel sounds are normal.     Palpations: Abdomen is soft.  Musculoskeletal:        General: Normal range of motion.     Cervical back: Normal range of motion.  Skin:    General: Skin is warm and dry.  Neurological:     General: No focal deficit present.     Mental Status: He is alert and oriented to person, place, and time.  Psychiatric:        Mood and Affect: Mood normal.        Behavior: Behavior normal.        Thought Content: Thought content normal.        Judgment: Judgment normal.      No results found for any visits on 08/12/23.  Recent Results (from the past 2160 hours)  Lipid panel     Status: None   Collection Time: 05/25/23 10:48 AM  Result Value Ref Range   Cholesterol, Total 153 100 - 199 mg/dL   Triglycerides 474 0 - 149 mg/dL   HDL 50 >25 mg/dL   VLDL Cholesterol Cal 21 5 - 40 mg/dL   LDL Chol Calc (NIH) 82 0 - 99 mg/dL   Chol/HDL Ratio 3.1 0.0 - 5.0 ratio    Comment:                                   T. Chol/HDL Ratio                                              Men  Women                               1/2 Avg.Risk  3.4    3.3                                   Avg.Risk  5.0    4.4                                2X Avg.Risk  9.6    7.1                                3X Avg.Risk 23.4   11.0   VITAMIN D 25 Hydroxy (Vit-D Deficiency, Fractures)     Status: None   Collection Time: 05/25/23 10:48 AM  Result Value Ref Range   Vit D, 25-Hydroxy 42.0 30.0 - 100.0 ng/mL    Comment: Vitamin D deficiency has been defined by the Institute of Medicine and an Endocrine Society practice guideline as a level of serum 25-OH vitamin D less than 20 ng/mL (1,2). The Endocrine Society went on  to further define vitamin D insufficiency as a level between 21 and 29 ng/mL (2). 1. IOM (Institute of Medicine). 2010. Dietary reference    intakes for calcium and D. [place]: The    [employer]. 2. Holick MF, [place], Bischoff-Ferrari HA, et al.    Evaluation, treatment, and prevention of vitamin D    deficiency: an Endocrine Society clinical practice    guideline. JCEM. 2011 Jul; 96(7):1911-30.   CMP14+EGFR     Status: Abnormal   Collection Time: 05/25/23 10:48 AM  Result Value Ref Range   Glucose 77 70 - 99 mg/dL   BUN 9 6 - 20 mg/dL   Creatinine, Ser 0.10 0.76 - 1.27 mg/dL   eGFR 932 >35 TD/DUK/0.25   BUN/Creatinine Ratio 10 9 - 20   Sodium 145 (H) 134 - 144 mmol/L   Potassium 4.1 3.5 - 5.2 mmol/L   Chloride 104 96 - 106 mmol/L   CO2 26 20 - 29 mmol/L   Calcium 9.9 8.7 - 10.2 mg/dL   Total Protein 6.8 6.0 - 8.5 g/dL   Albumin 4.7 4.3 - 5.2 g/dL   Globulin, Total 2.1 1.5 - 4.5 g/dL   Bilirubin Total 1.2 0.0 - 1.2 mg/dL   Alkaline Phosphatase 53 44 - 121 IU/L   AST 17 0 - 40 IU/L   ALT 28 0 - 44 IU/L  Hemoglobin A1c     Status: None   Collection Time: 05/25/23 10:48 AM  Result Value Ref Range   Hgb A1c MFr Bld 4.8 4.8 - 5.6 %    Comment:          Prediabetes: 5.7 - 6.4          Diabetes: >6.4          Glycemic control  for adults with diabetes: <7.0    Est. average glucose Bld gHb Est-mCnc 91 mg/dL  Vitamin K27     Status: Abnormal   Collection Time: 05/25/23 10:48 AM  Result Value Ref Range   Vitamin B-12 1,458 (H) 232 - 1,245 pg/mL  CBC with Diff     Status: None   Collection Time: 05/25/23 10:48 AM  Result Value Ref Range   WBC 7.3 3.4 - 10.8 x10E3/uL    Comment: **Effective May 31, 2023 profile [id number] WBC will be made**   non-orderable as a stand-alone order code.    RBC 4.84 4.14 - 5.80 x10E6/uL   Hemoglobin 14.1 13.0 - 17.7 g/dL   Hematocrit 28.3 15.1 - 51.0 %   MCV 92 79 - 97 fL   MCH 29.1 26.6 - 33.0 pg   MCHC 31.8 31.5 - 35.7 g/dL   RDW 76.1 60.7 - 37.1 %   Platelets 207 150 - 450 x10E3/uL   Neutrophils 63 Not Estab. %   Lymphs 27 Not Estab. %   Monocytes 8 Not Estab. %   Eos 1 Not Estab. %   Basos 1 Not Estab. %   Neutrophils Absolute 4.6 1.4 - 7.0 x10E3/uL   Lymphocytes Absolute 2.0 0.7 - 3.1 x10E3/uL   Monocytes Absolute 0.6 0.1 - 0.9 x10E3/uL   EOS (ABSOLUTE) 0.1 0.0 - 0.4 x10E3/uL   Basophils Absolute 0.0 0.0 - 0.2 x10E3/uL   Immature Granulocytes 0 Not Estab. %   Immature Grans (Abs) 0.0 0.0 - 0.1 x10E3/uL  TSH+T4F+T3Free     Status: Abnormal   Collection Time: 05/25/23 10:48 AM  Result Value Ref Range   TSH 5.180 (H) 0.450 - 4.500 uIU/mL  T3, Free 2.6 2.0 - 4.4 pg/mL   Free T4 1.37 0.82 - 1.77 ng/dL  Hepatitis C Ab reflex to Quant PCR     Status: None   Collection Time: 05/25/23 10:48 AM  Result Value Ref Range   HCV Ab Non Reactive Non Reactive  Interpretation:     Status: None   Collection Time: 05/25/23 10:48 AM  Result Value Ref Range   HCV Interp 1: Comment     Comment: Not infected with HCV unless early or acute infection is suspected (which may be delayed in an immunocompromised individual), or other evidence exists to indicate HCV infection.       Assessment & Plan:  Continue medications as prescribed by [facility].  Keflex sent in  Problem List Items  Addressed This Visit       Musculoskeletal and Integument   Impetigo, unspecified - Primary    Return if symptoms worsen or fail to improve.   Total time spent: 25 minutes  [employer], NP  08/12/2023   This document may have been prepared by Dragon Voice Recognition software and as such may include unintentional dictation errors.

## 2023-08-30 DIAGNOSIS — G629 Polyneuropathy, unspecified: Secondary | ICD-10-CM | POA: Diagnosis not present

## 2023-09-01 ENCOUNTER — Encounter: Payer: Self-pay | Admitting: Family

## 2023-09-01 ENCOUNTER — Ambulatory Visit: Admitting: Family

## 2023-09-01 VITALS — BP 108/72 | HR 102 | Ht 70.0 in | Wt 126.4 lb

## 2023-09-01 DIAGNOSIS — J069 Acute upper respiratory infection, unspecified: Secondary | ICD-10-CM

## 2023-09-01 DIAGNOSIS — L600 Ingrowing nail: Secondary | ICD-10-CM | POA: Diagnosis not present

## 2023-09-01 DIAGNOSIS — J101 Influenza due to other identified influenza virus with other respiratory manifestations: Secondary | ICD-10-CM

## 2023-09-01 DIAGNOSIS — L03032 Cellulitis of left toe: Secondary | ICD-10-CM | POA: Diagnosis not present

## 2023-09-01 LAB — POCT XPERT XPRESS SARS COVID-2/FLU/RSV
FLU A: POSITIVE
FLU B: NEGATIVE
RSV RNA, PCR: NEGATIVE
SARS Coronavirus 2: NEGATIVE

## 2023-09-01 MED ORDER — OSELTAMIVIR PHOSPHATE 75 MG PO CAPS: 75.0000 mg | ORAL_CAPSULE | Freq: Two times a day (BID) | ORAL | 0 refills | Status: AC

## 2023-09-01 NOTE — Progress Notes (Signed)
 Acute Office Visit  Subjective:     Patient ID: Richard Hebert, male    DOB: 1995-08-16, 28 y.o.   MRN: [id number]  Patient is in today for  Chief Complaint  Patient presents with   URI    URI  This is a new problem. The current episode started in the past 7 days. The problem has been gradually worsening. There has been no fever. Associated symptoms include congestion, coughing and rhinorrhea. He has tried acetaminophen, antihistamine, decongestant, increased fluids and sleep for the symptoms. The treatment provided no relief.     Review of Systems  Constitutional:  Positive for malaise/fatigue.  HENT:  Positive for congestion and rhinorrhea.   Respiratory:  Positive for cough.         Objective:    BP 108/72   Pulse (!) 102   Ht 5\' 10"  (1.778 m)   Wt 126 lb 6.4 oz (57.3 kg)   SpO2 98%   BMI 18.14 kg/m   Physical Exam Vitals and nursing note reviewed.  Constitutional:      Appearance: Normal appearance. He is normal weight.  Eyes:     Pupils: Pupils are equal, round, and reactive to light.  Cardiovascular:     Rate and Rhythm: Normal rate and regular rhythm.     Pulses: Normal pulses.     Heart sounds: Normal heart sounds.  Pulmonary:     Effort: Pulmonary effort is normal.     Breath sounds: Normal breath sounds.  Neurological:     Mental Status: He is alert.     Results for orders placed or performed in visit on 09/01/23  POCT XPERT XPRESS SARS COVID-2/FLU/RSV  Result Value Ref Range   SARS Coronavirus 2 negative    FLU A positive    FLU B negative    RSV RNA, PCR negative     Recent Results (from the past 2160 hours)  POCT XPERT XPRESS SARS COVID-2/FLU/RSV     Status: None   Collection Time: 09/01/23  3:46 PM  Result Value Ref Range   SARS Coronavirus 2 negative    FLU A positive    FLU B negative    RSV RNA, PCR negative     Allergies as of 09/01/2023   No Known Allergies      Medication List        Accurate as of September 01, 2023   6:27 PM. If you have any questions, ask your nurse or doctor.          Baclofen 5 MG Tabs TAKE ONE TABLET BY MOUTH TWICE DAILY AND TAKE 1 TABLET BY MOUTH 3 TIMES A DAY AS NEEDED   clindamycin 1 % gel Commonly known as: CLINDAGEL APPLY TOPICALLY TO AFFECTED AREA(S) 2 TIMES DAILY   cyanocobalamin 1000 MCG tablet Commonly known as: VITAMIN B12 Take 1 tablet (1,000 mcg total) by mouth daily.   Desitin Daily Defense 13 % Crea Generic drug: Zinc Oxide Apply 1 Application topically as needed (with changes and as needed.).   feeding supplement Liqd Take 237 mLs by mouth daily.   folic acid 1 MG tablet Commonly known as: FOLVITE Take 1 tablet (1 mg total) by mouth daily.   gabapentin 300 MG capsule Commonly known as: NEURONTIN Take 1 capsule (300 mg total) by mouth at bedtime.   haloperidol 2 MG tablet Commonly known as: HALDOL Take 1 tablet (2 mg total) by mouth every 6 (six) hours as needed for agitation.   hydrOXYzine  10 MG tablet Commonly known as: ATARAX Take 1 tablet (10 mg total) by mouth every 6 (six) hours as needed for itching or anxiety.   levothyroxine 100 MCG tablet Commonly known as: SYNTHROID Take 1 tablet (100 mcg total) by mouth daily before breakfast.   melatonin 3 MG Tabs tablet Take 1 tablet (3 mg total) by mouth at bedtime.   metoCLOPramide 5 MG tablet Commonly known as: REGLAN Take 1 tablet (5 mg total) by mouth 3 (three) times daily before meals.   metoprolol tartrate 25 MG tablet Commonly known as: LOPRESSOR Take 0.5 tablets (12.5 mg total) by mouth every 12 (twelve) hours.   multivitamin-prenatal 27-0.8 MG Tabs tablet Take 1 tablet by mouth daily at 12 noon.   oseltamivir 75 MG capsule Commonly known as: TAMIFLU Take 1 capsule (75 mg total) by mouth 2 (two) times daily for 5 days. Started by: Miki Kins   polyethylene glycol 17 g packet Commonly known as: MIRALAX / GLYCOLAX Take 17 g by mouth daily as needed for mild constipation  or moderate constipation.   potassium chloride SA 20 MEQ tablet Commonly known as: KLOR-CON M Take 2 tablets (40 mEq total) by mouth daily.   tamsulosin 0.4 MG Caps capsule Commonly known as: FLOMAX TAKE 1 CAPSULE BY MOUTH EVERY DAY 30 MINUTES AFTER SAME MEAL EACH DAY   traZODone 100 MG tablet Commonly known as: DESYREL Take 1 tablet (100 mg total) by mouth at bedtime.            Assessment & Plan:   Problem List Items Addressed This Visit   None Visit Diagnoses       Upper respiratory tract infection, unspecified type    -  Primary   Relevant Medications   oseltamivir (TAMIFLU) 75 MG capsule   Other Relevant Orders   POCT XPERT XPRESS SARS COVID-2/FLU/RSV (Completed)     Influenza A       Relevant Medications   oseltamivir (TAMIFLU) 75 MG capsule   Other Relevant Orders   POCT XPERT XPRESS SARS COVID-2/FLU/RSV (Completed)        Return as previously scheduled unless not improving.  Total time spent: 20 minutes  Miki Kins, FNP  09/01/2023   This document may have been prepared by [facility] Voice Recognition software and as such may include unintentional dictation errors.

## 2023-09-02 DIAGNOSIS — G629 Polyneuropathy, unspecified: Secondary | ICD-10-CM | POA: Diagnosis not present

## 2023-09-15 DIAGNOSIS — H93293 Other abnormal auditory perceptions, bilateral: Secondary | ICD-10-CM | POA: Diagnosis not present

## 2023-09-15 DIAGNOSIS — H6123 Impacted cerumen, bilateral: Secondary | ICD-10-CM | POA: Diagnosis not present

## 2023-09-23 ENCOUNTER — Ambulatory Visit (INDEPENDENT_AMBULATORY_CARE_PROVIDER_SITE_OTHER): Payer: Medicare HMO | Admitting: Family

## 2023-09-23 VITALS — BP 124/70 | HR 84 | Ht 70.0 in | Wt 130.4 lb

## 2023-09-23 DIAGNOSIS — G7089 Other specified myoneural disorders: Secondary | ICD-10-CM

## 2023-09-23 DIAGNOSIS — E039 Hypothyroidism, unspecified: Secondary | ICD-10-CM

## 2023-09-23 DIAGNOSIS — E559 Vitamin D deficiency, unspecified: Secondary | ICD-10-CM

## 2023-09-23 DIAGNOSIS — F840 Autistic disorder: Secondary | ICD-10-CM | POA: Diagnosis not present

## 2023-09-23 DIAGNOSIS — Z0000 Encounter for general adult medical examination without abnormal findings: Secondary | ICD-10-CM | POA: Diagnosis not present

## 2023-09-23 DIAGNOSIS — R531 Weakness: Secondary | ICD-10-CM | POA: Diagnosis not present

## 2023-09-23 DIAGNOSIS — D519 Vitamin B12 deficiency anemia, unspecified: Secondary | ICD-10-CM | POA: Diagnosis not present

## 2023-09-23 DIAGNOSIS — Z0001 Encounter for general adult medical examination with abnormal findings: Secondary | ICD-10-CM | POA: Diagnosis not present

## 2023-09-23 DIAGNOSIS — E782 Mixed hyperlipidemia: Secondary | ICD-10-CM | POA: Diagnosis not present

## 2023-09-23 DIAGNOSIS — R7303 Prediabetes: Secondary | ICD-10-CM | POA: Diagnosis not present

## 2023-09-23 DIAGNOSIS — Z0130 Encounter for examination of blood pressure without abnormal findings: Secondary | ICD-10-CM

## 2023-09-23 NOTE — Assessment & Plan Note (Signed)
 Patient stable.  Well controlled with current therapy.   Continue current meds.

## 2023-09-23 NOTE — Progress Notes (Signed)
 Complete physical exam  Patient: Richard Hebert   DOB: 1996/02/28   27 y.o. Male  MRN: [id number]  Subjective:    Chief Complaint  Patient presents with   Annual Exam    Richard Hebert is a 28 y.o. male who presents today for a complete physical exam. His caregiver reports his consuming a general diet. Exercise is limited by neurologic condition(s): myoneural disorder. Marland Kitchen He generally feels well. Caregiver reports his sleeping fairly well. He does not have additional problems to discuss today.    Most recent fall risk assessment:    07/13/2022    1:56 PM  Fall Risk   Falls in the past year? 0  Number falls in past yr: 0     Most recent depression screenings:    11/24/2022    6:00 PM 07/13/2022    1:57 PM  PHQ 2/9 Scores  PHQ - 2 Score 1 0  PHQ- 9 Score  3      Past Medical History:  Diagnosis Date   Hypokalemia 07/14/2022   Formatting of this note might be different from the original. Last Assessment & Plan:  Formatting of this note might be different from the original.  - This could certainly be contributing to his muscle weakness.  - We will aggressively replace potassium and follow.   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  Family History  Problem Relation Age of Onset   Heart disease Maternal Aunt     Social History   Socioeconomic History   Marital status: Single    Spouse name: Not on file   Number of children: Not on file   Years of education: Not on file   Highest education level: Not on file  Occupational History   Not on file  Tobacco Use   Smoking status: Never   Smokeless tobacco: Never  Substance and Sexual Activity   Alcohol use: Never   Drug use: Never   Sexual activity: Not on file  Other Topics Concern   Not on file  Social History Narrative   Not on file   Social Drivers  of Health   Financial Resource Strain: Low Risk  (08/07/2022)   [facility], [facility]   Overall Financial Resource Strain (CARDIA)    Difficulty of Paying Living Expenses: Not hard at all  Food Insecurity: No Food Insecurity (08/07/2022)   Received from [facility], [facility]   Hunger Vital Sign    Worried About Running Out of Food in the Last Year: Never true    Ran Out of Food in the Last Year: Never true  Transportation Needs: No Transportation Needs (08/07/2022)   Received from [facility], [facility]   [facility] - Transportation    In the past 12 months, has lack of transportation kept you from medical appointments or from getting medications?: No    Lack of Transportation (Non-Medical): No  Physical Activity: Not on file  Stress: Not on file  Social Connections: Not on file  Intimate Partner Violence: Not At Risk (07/23/2022)   Humiliation, Afraid, Rape, and Kick questionnaire    Fear of Current or Ex-Partner: No    Emotionally Abused: No    Physically Abused: No    Sexually Abused: No  Outpatient Medications Prior to Visit  Medication Sig   Baclofen 5 MG TABS TAKE ONE TABLET BY MOUTH TWICE DAILY AND TAKE 1 TABLET BY MOUTH 3 TIMES A DAY AS NEEDED   clindamycin (CLINDAGEL) 1 % gel APPLY TOPICALLY TO AFFECTED AREA(S) 2 TIMES DAILY   cyanocobalamin (VITAMIN B12) 1000 MCG tablet Take 1 tablet (1,000 mcg total) by mouth daily.   feeding supplement (ENSURE ENLIVE / ENSURE PLUS) LIQD Take 237 mLs by mouth daily.   folic acid (FOLVITE) 1 MG tablet Take 1 tablet (1 mg total) by mouth daily.   gabapentin (NEURONTIN) 300 MG capsule Take 1 capsule (300 mg total) by mouth at bedtime.   haloperidol (HALDOL) 2 MG tablet Take 1 tablet (2 mg total) by mouth every 6 (six) hours as needed for agitation.   hydrOXYzine (ATARAX) 10 MG tablet Take 1 tablet (10 mg total) by mouth  every 6 (six) hours as needed for itching or anxiety.   levothyroxine (SYNTHROID) 100 MCG tablet Take 1 tablet (100 mcg total) by mouth daily before breakfast.   melatonin 3 MG TABS tablet Take 1 tablet (3 mg total) by mouth at bedtime.   metoCLOPramide (REGLAN) 5 MG tablet Take 1 tablet (5 mg total) by mouth 3 (three) times daily before meals.   metoprolol tartrate (LOPRESSOR) 25 MG tablet Take 0.5 tablets (12.5 mg total) by mouth every 12 (twelve) hours.   polyethylene glycol (MIRALAX / GLYCOLAX) 17 g packet Take 17 g by mouth daily as needed for mild constipation or moderate constipation.   potassium chloride SA (KLOR-CON M) 20 MEQ tablet Take 2 tablets (40 mEq total) by mouth daily.   Prenatal Vit-Fe Fumarate-FA (MULTIVITAMIN-PRENATAL) 27-0.8 MG TABS tablet Take 1 tablet by mouth daily at 12 noon.   tamsulosin (FLOMAX) 0.4 MG CAPS capsule TAKE 1 CAPSULE BY MOUTH EVERY DAY 30 MINUTES AFTER SAME MEAL EACH DAY   traZODone (DESYREL) 100 MG tablet Take 1 tablet (100 mg total) by mouth at bedtime.   Zinc Oxide (DESITIN DAILY DEFENSE) 13 % CREA Apply 1 Application topically as needed (with changes and as needed.).   No facility-administered medications prior to visit.    Review of Systems  All other systems reviewed and are negative.       Objective:      BP 124/70   Pulse 84   Ht 5\' 10"  (1.778 m)   Wt 130 lb 6.4 oz (59.1 kg)   SpO2 99%   BMI 18.71 kg/m    Physical Exam Vitals and nursing note reviewed.  Constitutional:      Appearance: Normal appearance. He is normal weight.  Eyes:     Pupils: Pupils are equal, round, and reactive to light.  Cardiovascular:     Rate and Rhythm: Normal rate and regular rhythm.     Pulses: Normal pulses.     Heart sounds: Normal heart sounds.  Pulmonary:     Effort: Pulmonary effort is normal.     Breath sounds: Normal breath sounds.  Neurological:     Mental Status: He is alert. Mental status is at baseline.     Motor: Weakness present.      Coordination: Coordination abnormal.     Gait: Gait abnormal.     Deep Tendon Reflexes: Reflexes abnormal.  Psychiatric:        Mood and Affect: Mood and affect normal.        Speech: He is noncommunicative.        Behavior: Behavior normal.  Behavior is cooperative.        Cognition and Memory: Cognition is impaired.        Judgment: Judgment is impulsive.      No results found for any visits on 09/23/23.  Recent Results (from the past 2160 hours)  POCT XPERT XPRESS SARS COVID-2/FLU/RSV     Status: None   Collection Time: 09/01/23  3:46 PM  Result Value Ref Range   SARS Coronavirus 2 negative    FLU A positive    FLU B negative    RSV RNA, PCR negative         Assessment & Plan:    Routine Health Maintenance and Physical Exam  Immunization History  Administered Date(s) Administered   Influenza, Mdck, Trivalent,PF 6+ MOS(egg free) 05/25/2023    Health Maintenance  Topic Date Due   Medicare Annual Wellness (AWV)  Never done   FOOT EXAM  Never done   OPHTHALMOLOGY EXAM  Never done   HPV VACCINES (1 - Male 3-dose series) Never done   DTaP/Tdap/Td (1 - Tdap) Never done   COVID-19 Vaccine (1 - 2024-25 season) Never done   Diabetic kidney evaluation - Urine ACR  03/09/2024 (Originally 06/18/2015)   HEMOGLOBIN A1C  11/22/2023   Diabetic kidney evaluation - eGFR measurement  05/24/2024   INFLUENZA VACCINE  Completed   Hepatitis C Screening  Completed   HIV Screening  Completed    Discussed health benefits of physical activity, and encouraged him to engage in regular exercise appropriate for his age and condition.  Problem List Items Addressed This Visit       Endocrine   Hypothyroidism, unspecified   Checking labs today.  Will continue supplements as needed.        Relevant Orders   CMP14+EGFR   TSH   CBC with Differential/Platelet     Nervous and Auditory   Other specified myoneural disorders (HCC)   Patient stable.  Well controlled with current  therapy.   Continue current meds.          Other   Autistic disorder   Patient stable.  Well controlled with current therapy.   Continue current meds.        Relevant Orders   CMP14+EGFR   CBC with Differential/Platelet   Generalized weakness   Patient stable.  Well controlled with current therapy.   Continue current meds.       Relevant Orders   CMP14+EGFR   CBC with Differential/Platelet   Vitamin D deficiency   Relevant Orders   VITAMIN D 25 Hydroxy (Vit-D Deficiency, Fractures)   CMP14+EGFR   CBC with Differential/Platelet   Vitamin B12 deficiency anemia, unspecified   Checking labs today.  Will continue supplements as needed.        Relevant Orders   CMP14+EGFR   Vitamin B12   CBC with Differential/Platelet   Other Visit Diagnoses       Routine general medical examination at a health care facility    -  Primary   Relevant Orders   CMP14+EGFR   CBC with Differential/Platelet     Prediabetes       A1C is in prediabetic ranges. Patient counseled on dietary choices and verbalized understanding. Will reassess at follow up after next lab check.   Relevant Orders   CMP14+EGFR   Hemoglobin A1c   CBC with Differential/Platelet     Mixed hyperlipidemia       Checking labs today.  Continue current therapy for lipid control. Will  modify as needed based on labwork results.   Relevant Orders   Lipid panel   CMP14+EGFR   CBC with Differential/Platelet      Return in about 4 months (around 01/23/2024) for F/U.     Miki Kins, FNP  09/23/2023   This document may have been prepared by [facility] Voice Recognition software and as such may include unintentional dictation errors.

## 2023-09-23 NOTE — Assessment & Plan Note (Signed)
 Checking labs today.  Will continue supplements as needed.

## 2023-09-24 LAB — TSH: TSH: 7.25 u[IU]/mL — ABNORMAL HIGH (ref 0.450–4.500)

## 2023-09-24 LAB — CMP14+EGFR
ALT: 22 IU/L (ref 0–44)
AST: 17 IU/L (ref 0–40)
Albumin: 4.7 g/dL (ref 4.3–5.2)
Alkaline Phosphatase: 75 IU/L (ref 44–121)
BUN/Creatinine Ratio: 10 (ref 9–20)
BUN: 9 mg/dL (ref 6–20)
Bilirubin Total: 1.3 mg/dL — ABNORMAL HIGH (ref 0.0–1.2)
CO2: 20 mmol/L (ref 20–29)
Calcium: 10.1 mg/dL (ref 8.7–10.2)
Chloride: 108 mmol/L — ABNORMAL HIGH (ref 96–106)
Creatinine, Ser: 0.87 mg/dL (ref 0.76–1.27)
Globulin, Total: 2.2 g/dL (ref 1.5–4.5)
Glucose: 82 mg/dL (ref 70–99)
Potassium: 4 mmol/L (ref 3.5–5.2)
Sodium: 143 mmol/L (ref 134–144)
Total Protein: 6.9 g/dL (ref 6.0–8.5)
eGFR: 122 mL/min/{1.73_m2} (ref >59)

## 2023-09-24 LAB — LIPID PANEL
Chol/HDL Ratio: 2.9 ratio (ref 0.0–5.0)
Cholesterol, Total: 159 mg/dL (ref 100–199)
HDL: 54 mg/dL (ref >39)
LDL Chol Calc (NIH): 84 mg/dL (ref 0–99)
Triglycerides: 120 mg/dL (ref 0–149)
VLDL Cholesterol Cal: 21 mg/dL (ref 5–40)

## 2023-09-24 LAB — VITAMIN D 25 HYDROXY (VIT D DEFICIENCY, FRACTURES): Vit D, 25-Hydroxy: 43.3 ng/mL (ref 30.0–100.0)

## 2023-09-24 LAB — VITAMIN B12: Vitamin B-12: 1701 pg/mL — ABNORMAL HIGH (ref 232–1245)

## 2023-09-24 LAB — HEMOGLOBIN A1C
Est. average glucose Bld gHb Est-mCnc: 91 mg/dL
Hgb A1c MFr Bld: 4.8 % (ref 4.8–5.6)

## 2023-09-29 ENCOUNTER — Other Ambulatory Visit: Payer: Self-pay | Admitting: Family

## 2023-09-29 DIAGNOSIS — L700 Acne vulgaris: Secondary | ICD-10-CM

## 2023-10-14 ENCOUNTER — Other Ambulatory Visit: Payer: Self-pay

## 2023-10-14 DIAGNOSIS — E039 Hypothyroidism, unspecified: Secondary | ICD-10-CM

## 2023-10-20 ENCOUNTER — Other Ambulatory Visit: Payer: Self-pay | Admitting: Family

## 2023-10-26 DIAGNOSIS — F73 Profound intellectual disabilities: Secondary | ICD-10-CM | POA: Diagnosis not present

## 2023-10-26 DIAGNOSIS — R531 Weakness: Secondary | ICD-10-CM | POA: Diagnosis not present

## 2023-10-26 DIAGNOSIS — R7989 Other specified abnormal findings of blood chemistry: Secondary | ICD-10-CM | POA: Diagnosis not present

## 2023-10-26 DIAGNOSIS — G629 Polyneuropathy, unspecified: Secondary | ICD-10-CM | POA: Diagnosis not present

## 2023-10-26 DIAGNOSIS — F840 Autistic disorder: Secondary | ICD-10-CM | POA: Diagnosis not present

## 2023-10-28 DIAGNOSIS — F801 Expressive language disorder: Secondary | ICD-10-CM | POA: Diagnosis not present

## 2023-10-28 DIAGNOSIS — G629 Polyneuropathy, unspecified: Secondary | ICD-10-CM | POA: Diagnosis not present

## 2023-10-28 DIAGNOSIS — R7989 Other specified abnormal findings of blood chemistry: Secondary | ICD-10-CM | POA: Diagnosis not present

## 2023-10-28 DIAGNOSIS — F79 Unspecified intellectual disabilities: Secondary | ICD-10-CM | POA: Diagnosis not present

## 2023-10-28 DIAGNOSIS — F840 Autistic disorder: Secondary | ICD-10-CM | POA: Diagnosis not present

## 2023-11-23 DIAGNOSIS — Z0100 Encounter for examination of eyes and vision without abnormal findings: Secondary | ICD-10-CM | POA: Diagnosis not present

## 2023-12-15 ENCOUNTER — Other Ambulatory Visit: Payer: Self-pay | Admitting: Family

## 2023-12-27 ENCOUNTER — Ambulatory Visit (INDEPENDENT_AMBULATORY_CARE_PROVIDER_SITE_OTHER): Admitting: Family

## 2023-12-27 ENCOUNTER — Encounter: Payer: Self-pay | Admitting: Family

## 2023-12-27 DIAGNOSIS — D519 Vitamin B12 deficiency anemia, unspecified: Secondary | ICD-10-CM

## 2023-12-27 DIAGNOSIS — E559 Vitamin D deficiency, unspecified: Secondary | ICD-10-CM | POA: Diagnosis not present

## 2023-12-27 DIAGNOSIS — E039 Hypothyroidism, unspecified: Secondary | ICD-10-CM | POA: Diagnosis not present

## 2023-12-27 DIAGNOSIS — R531 Weakness: Secondary | ICD-10-CM | POA: Diagnosis not present

## 2023-12-27 DIAGNOSIS — E782 Mixed hyperlipidemia: Secondary | ICD-10-CM | POA: Diagnosis not present

## 2023-12-27 DIAGNOSIS — R7303 Prediabetes: Secondary | ICD-10-CM | POA: Diagnosis not present

## 2023-12-27 DIAGNOSIS — F840 Autistic disorder: Secondary | ICD-10-CM | POA: Diagnosis not present

## 2023-12-27 DIAGNOSIS — Z0000 Encounter for general adult medical examination without abnormal findings: Secondary | ICD-10-CM | POA: Diagnosis not present

## 2023-12-27 DIAGNOSIS — Z0130 Encounter for examination of blood pressure without abnormal findings: Secondary | ICD-10-CM

## 2023-12-27 NOTE — Assessment & Plan Note (Signed)
 Checking labs today.  Will continue supplements as needed.   - Vitamin D  - Vitamin B12 - TSH

## 2023-12-27 NOTE — Assessment & Plan Note (Signed)
 Patient is seen by Psychiatry, who manage this condition.  He is well controlled with current therapy.   Will defer to them for further changes to plan of care.

## 2023-12-27 NOTE — Progress Notes (Signed)
 Established Patient Office Visit  Subjective:  Patient ID: Richard Hebert, male    DOB: 1995-11-11  Age: 28 y.o. MRN: [id number]  Chief Complaint  Patient presents with   Follow-up    3 month follow up    Patient is here today for his 3 months follow up.  He has been feeling well since last appointment.   He does not have additional concerns to discuss today.  Labs are due today. He needs refills.   I have reviewed his active problem list, medication list, allergies, notes from last encounter, lab results for his appointment today.     No other concerns at this time.   Past Medical History:  Diagnosis Date   Hypokalemia 07/14/2022   Formatting of this note might be different from the original. Last Assessment & Plan:  Formatting of this note might be different from the original.  - This could certainly be contributing to his muscle weakness.  - We will aggressively replace potassium and follow.   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  Social History   Socioeconomic History   Marital status: Single    Spouse name: Not on file   Number of children: Not on file   Years of education: Not on file   Highest education level: Not on file  Occupational History   Not on file  Tobacco Use   Smoking status: Never   Smokeless tobacco: Never  Substance and Sexual Activity   Alcohol use: Never   Drug use: Never   Sexual activity: Not on file  Other Topics Concern   Not on file  Social History Narrative   Not on file   Social Drivers of Health   Financial Resource Strain: Low Risk  (08/07/2022)   Received from [facility]   Overall Financial Resource Strain (CARDIA)    Difficulty of Paying Living Expenses: Not hard at all  Food Insecurity: No Food Insecurity (08/07/2022)   Received from [facility]   Hunger Vital Sign    Within the past 12 months, you worried that your food would run out before you got the money to buy more.: Never true    Within the past 12 months, the food you bought just didn't last and you didn't have money to get more.: Never true  Transportation Needs: No Transportation Needs (08/07/2022)   Received from [facility] - Transportation    In the past 12 months, has lack of transportation kept you from medical appointments or from getting medications?: No    Lack of Transportation (Non-Medical): No  Physical Activity: Not on file  Stress: Not on file  Social Connections: Not on file  Intimate Partner Violence: Not At Risk (07/23/2022)   Humiliation, Afraid, Rape, and Kick questionnaire    Fear of Current or Ex-Partner: No    Emotionally Abused: No    Physically Abused: No    Sexually Abused: No    Family History  Problem Relation Age of Onset   Heart disease Maternal Aunt     No Known Allergies  Review of Systems  All other systems reviewed and are negative.      Objective:   BP 90/60   Pulse 86   Ht 5' 10 (1.778 m)   Wt 130 lb (59  kg)   SpO2 97%   BMI 18.65 kg/m   Vitals:   12/27/23 1011  BP: 90/60  Pulse: 86  Height: 5' 10 (1.778 m)  Weight: 130 lb (59 kg)  SpO2: 97%  BMI (Calculated): 18.65    Physical Exam Vitals and nursing note reviewed.  Constitutional:      Appearance: Normal appearance. He is normal weight.   Eyes:     Pupils: Pupils are equal, round, and reactive to light.    Cardiovascular:     Rate and Rhythm: Normal rate and regular rhythm.     Pulses: Normal pulses.     Heart sounds: Normal heart sounds.  Pulmonary:     Effort: Pulmonary effort is normal.     Breath sounds: Normal breath sounds.   Neurological:     Mental Status: He is alert.     Sensory: Sensory deficit present.     Gait: Gait abnormal.   Psychiatric:        Mood and Affect: Mood  normal.        Cognition and Memory: Cognition is impaired.     Comments: Repetitive speech, exhibiting stimming behaviors (Rocking, etc).       No results found for any visits on 12/27/23.  No results found for this or any previous visit (from the past 2160 hours).     Assessment & Plan Anemia due to vitamin B12 deficiency, unspecified B12 deficiency type Vitamin D  deficiency Acquired hypothyroidism Checking labs today.  Will continue supplements as needed.   - Vitamin D  - Vitamin B12 - TSH  Prediabetes A1C Continues to be in prediabetic ranges.  Will reassess at follow up after next lab check.  Patient counseled on dietary choices and verbalized understanding.   -CBC w/Diff -CMP w/eGFR -Hemoglobin A1C  Autistic disorder Patient is seen by Psychiatry, who manage this condition.  He is well controlled with current therapy.   Will defer to them for further changes to plan of care.   Mixed hyperlipidemia Checking labs today.  Continue current therapy for lipid control. Will modify as needed based on labwork results.   -CMP w/eGFR -Lipid Panel     Return in about 4 months (around 04/27/2024) for F/U.   Total time spent: 20 minutes  ALAN CHRISTELLA ARRANT, FNP  12/27/2023   This document may have been prepared by [facility] Voice Recognition software and as such may include unintentional dictation errors.

## 2023-12-28 ENCOUNTER — Ambulatory Visit: Payer: Self-pay | Admitting: Internal Medicine

## 2023-12-28 DIAGNOSIS — E039 Hypothyroidism, unspecified: Secondary | ICD-10-CM

## 2023-12-28 LAB — CBC WITH DIFFERENTIAL/PLATELET
Basophils Absolute: 0.1 10*3/uL (ref 0.0–0.2)
Basos: 1 %
EOS (ABSOLUTE): 0.1 10*3/uL (ref 0.0–0.4)
Eos: 2 %
Hematocrit: 48.3 % (ref 37.5–51.0)
Hemoglobin: 16.1 g/dL (ref 13.0–17.7)
Immature Grans (Abs): 0 10*3/uL (ref 0.0–0.1)
Immature Granulocytes: 0 %
Lymphocytes Absolute: 2 10*3/uL (ref 0.7–3.1)
Lymphs: 26 %
MCH: 30.6 pg (ref 26.6–33.0)
MCHC: 33.3 g/dL (ref 31.5–35.7)
MCV: 92 fL (ref 79–97)
Monocytes Absolute: 0.6 10*3/uL (ref 0.1–0.9)
Monocytes: 8 %
Neutrophils Absolute: 4.9 10*3/uL (ref 1.4–7.0)
Neutrophils: 63 %
Platelets: 206 10*3/uL (ref 150–450)
RBC: 5.26 x10E6/uL (ref 4.14–5.80)
RDW: 12 % (ref 11.6–15.4)
WBC: 7.7 10*3/uL (ref 3.4–10.8)

## 2023-12-28 LAB — LIPID PANEL
Chol/HDL Ratio: 3.4 ratio (ref 0.0–5.0)
Cholesterol, Total: 158 mg/dL (ref 100–199)
HDL: 47 mg/dL (ref >39)
LDL Chol Calc (NIH): 88 mg/dL (ref 0–99)
Triglycerides: 129 mg/dL (ref 0–149)
VLDL Cholesterol Cal: 23 mg/dL (ref 5–40)

## 2023-12-28 LAB — CMP14+EGFR
ALT: 22 IU/L (ref 0–44)
AST: 18 IU/L (ref 0–40)
Albumin: 4.6 g/dL (ref 4.3–5.2)
Alkaline Phosphatase: 64 IU/L (ref 44–121)
BUN/Creatinine Ratio: 13 (ref 9–20)
BUN: 12 mg/dL (ref 6–20)
Bilirubin Total: 0.8 mg/dL (ref 0.0–1.2)
CO2: 21 mmol/L (ref 20–29)
Calcium: 10 mg/dL (ref 8.7–10.2)
Chloride: 107 mmol/L — ABNORMAL HIGH (ref 96–106)
Creatinine, Ser: 0.95 mg/dL (ref 0.76–1.27)
Globulin, Total: 2 g/dL (ref 1.5–4.5)
Glucose: 82 mg/dL (ref 70–99)
Potassium: 4.2 mmol/L (ref 3.5–5.2)
Sodium: 143 mmol/L (ref 134–144)
Total Protein: 6.6 g/dL (ref 6.0–8.5)
eGFR: 113 mL/min/{1.73_m2} (ref >59)

## 2023-12-28 LAB — TSH: TSH: 4.81 u[IU]/mL — ABNORMAL HIGH (ref 0.450–4.500)

## 2023-12-28 LAB — VITAMIN B12: Vitamin B-12: 1623 pg/mL — ABNORMAL HIGH (ref 232–1245)

## 2023-12-28 LAB — HEMOGLOBIN A1C
Est. average glucose Bld gHb Est-mCnc: 97 mg/dL
Hgb A1c MFr Bld: 5 % (ref 4.8–5.6)

## 2023-12-28 LAB — VITAMIN D 25 HYDROXY (VIT D DEFICIENCY, FRACTURES): Vit D, 25-Hydroxy: 37.4 ng/mL (ref 30.0–100.0)

## 2023-12-28 MED ORDER — LEVOTHYROXINE SODIUM 112 MCG PO TABS: 112.0000 ug | ORAL_TABLET | Freq: Every day | ORAL | 11 refills | Status: AC

## 2023-12-28 NOTE — Progress Notes (Signed)
 Patient notified

## 2024-01-18 ENCOUNTER — Other Ambulatory Visit: Payer: Self-pay | Admitting: Family

## 2024-03-02 DIAGNOSIS — L600 Ingrowing nail: Secondary | ICD-10-CM | POA: Diagnosis not present

## 2024-03-02 DIAGNOSIS — L03032 Cellulitis of left toe: Secondary | ICD-10-CM | POA: Diagnosis not present

## 2024-03-06 ENCOUNTER — Other Ambulatory Visit: Payer: Self-pay | Admitting: Family

## 2024-03-08 ENCOUNTER — Other Ambulatory Visit: Payer: Self-pay | Admitting: Family

## 2024-04-25 DIAGNOSIS — L03032 Cellulitis of left toe: Secondary | ICD-10-CM | POA: Diagnosis not present

## 2024-04-25 DIAGNOSIS — L600 Ingrowing nail: Secondary | ICD-10-CM | POA: Diagnosis not present

## 2024-04-27 ENCOUNTER — Encounter: Payer: Self-pay | Admitting: Family

## 2024-04-27 ENCOUNTER — Ambulatory Visit: Admitting: Family

## 2024-04-27 VITALS — BP 90/58 | HR 86 | Ht 70.0 in | Wt 135.0 lb

## 2024-04-27 DIAGNOSIS — E559 Vitamin D deficiency, unspecified: Secondary | ICD-10-CM

## 2024-04-27 DIAGNOSIS — R7303 Prediabetes: Secondary | ICD-10-CM

## 2024-04-27 DIAGNOSIS — E039 Hypothyroidism, unspecified: Secondary | ICD-10-CM | POA: Diagnosis not present

## 2024-04-27 DIAGNOSIS — Z23 Encounter for immunization: Secondary | ICD-10-CM

## 2024-04-27 DIAGNOSIS — D519 Vitamin B12 deficiency anemia, unspecified: Secondary | ICD-10-CM

## 2024-04-27 DIAGNOSIS — F840 Autistic disorder: Secondary | ICD-10-CM | POA: Diagnosis not present

## 2024-04-27 DIAGNOSIS — E782 Mixed hyperlipidemia: Secondary | ICD-10-CM

## 2024-04-27 DIAGNOSIS — Z0130 Encounter for examination of blood pressure without abnormal findings: Secondary | ICD-10-CM

## 2024-04-27 NOTE — Progress Notes (Signed)
 "  Established Patient Office Visit  Subjective:  Patient ID: Richard Hebert, male    DOB: 11/23/1995  Age: 28 y.o. MRN: [id number]  Chief Complaint  Patient presents with   Follow-up    4 month follow up    Patient is here today for his 4 months follow up.  He has been feeling fairly well since last appointment.   He does not have additional concerns to discuss today.  Labs are due today.  He needs refills.   I have reviewed his active problem list, medication list, allergies, health maintenance, notes from last encounter, lab results for his appointment today.      No other concerns at this time.   Past Medical History:  Diagnosis Date   Hypokalemia 07/14/2022   Formatting of this note might be different from the original. Last Assessment & Plan:  Formatting of this note might be different from the original.  - This could certainly be contributing to his muscle weakness.  - We will aggressively replace potassium and follow.   Hyponatremia 07/26/2022   Metabolic acidosis 07/23/2022   Reactive thrombocytosis 07/23/2022   Thyroid  disease    Traumatic rhabdomyolysis 07/22/2022    No past surgical history on file.  Social History   Socioeconomic History   Marital status: Single    Spouse name: Not on file   Number of children: Not on file   Years of education: Not on file   Highest education level: Not on file  Occupational History   Not on file  Tobacco Use   Smoking status: Never   Smokeless tobacco: Never  Substance and Sexual Activity   Alcohol use: Never   Drug use: Never   Sexual activity: Not on file  Other Topics Concern   Not on file  Social History Narrative   Not on file   Social Drivers of Health   Financial Resource Strain: Low Risk  (04/25/2024)   Received from [facility]   Overall Financial Resource Strain (CARDIA)    Difficulty of Paying Living Expenses: Not hard at all  Food Insecurity: No Food Insecurity (04/25/2024)    Received from [facility]   Hunger Vital Sign    Within the past 12 months, you worried that your food would run out before you got the money to buy more.: Never true    Within the past 12 months, the food you bought just didn't last and you didn't have money to get more.: Never true  Transportation Needs: No Transportation Needs (04/25/2024)   Received from [facility] - Transportation    In the past 12 months, has lack of transportation kept you from medical appointments or from getting medications?: No    Lack of Transportation (Non-Medical): No  Physical Activity: Not on file  Stress: Not on file  Social Connections: Not on file  Intimate Partner Violence: Not At Risk (07/23/2022)   Humiliation, Afraid, Rape, and Kick questionnaire    Fear of Current or Ex-Partner: No    Emotionally Abused: No    Physically Abused: No    Sexually Abused: No    Family History  Problem Relation Age of Onset   Heart disease Maternal Aunt     No Known Allergies  Review of Systems  All other systems reviewed and are negative.      Objective:   BP (!) 90/58   Pulse 86   Ht 5' 10 (1.778 m)  Wt 135 lb (61.2 kg)   SpO2 99%   BMI 19.37 kg/m   Vitals:   04/27/24 1036  BP: (!) 90/58  Pulse: 86  Height: 5' 10 (1.778 m)  Weight: 135 lb (61.2 kg)  SpO2: 99%  BMI (Calculated): 19.37    Physical Exam Vitals and nursing note reviewed.  Constitutional:      Appearance: Normal appearance. He is normal weight.  Eyes:     Pupils: Pupils are equal, round, and reactive to light.  Cardiovascular:     Rate and Rhythm: Normal rate and regular rhythm.     Pulses: Normal pulses.     Heart sounds: Normal heart sounds.  Pulmonary:     Effort: Pulmonary effort is normal.     Breath sounds: Normal breath sounds.  Neurological:     General: No focal deficit present.     Mental Status: He is alert and oriented to person, place, and time. Mental  status is at baseline.  Psychiatric:        Mood and Affect: Mood normal.        Behavior: Behavior normal.        Thought Content: Thought content normal.        Judgment: Judgment normal.      No results found for any visits on 04/27/24.  No results found for this or any previous visit (from the past 2160 hours).     Assessment & Plan Flu vaccine need Flu vaccine given in office today.  Vitamin D  deficiency Anemia due to vitamin B12 deficiency, unspecified B12 deficiency type Acquired hypothyroidism Checking labs today.  Will continue supplements as needed.   - Vitamin D  - Vitamin B12 - TSH  Prediabetes A1C Continues to be in prediabetic ranges.  Will reassess at follow up after next lab check.  Patient counseled on dietary choices and verbalized understanding.   -CBC w/Diff -CMP w/eGFR -Hemoglobin A1C  Autistic disorder Patient stable.  Well controlled with current therapy.   Continue current meds.   Mixed hyperlipidemia Checking labs today.  Continue current therapy for lipid control. Will modify as needed based on labwork results.   -CMP w/eGFR -Lipid Panel     Return in about 3 months (around 07/28/2024).   Total time spent: 20 minutes  ALAN CHRISTELLA ARRANT, FNP  04/27/2024   This document may have been prepared by [facility] Voice Recognition software and as such may include unintentional dictation errors.  "

## 2024-05-01 LAB — CMP14+EGFR
ALT: 17 IU/L (ref 0–44)
AST: 23 IU/L (ref 0–40)
Albumin: 4.5 g/dL (ref 4.3–5.2)
Alkaline Phosphatase: 81 IU/L (ref 47–123)
BUN/Creatinine Ratio: 8 — ABNORMAL LOW (ref 9–20)
BUN: 8 mg/dL (ref 6–20)
Bilirubin Total: 0.5 mg/dL (ref 0.0–1.2)
CO2: 20 mmol/L (ref 20–29)
Calcium: 9.8 mg/dL (ref 8.7–10.2)
Chloride: 105 mmol/L (ref 96–106)
Creatinine, Ser: 0.99 mg/dL (ref 0.76–1.27)
Globulin, Total: 2.3 g/dL (ref 1.5–4.5)
Glucose: 89 mg/dL (ref 70–99)
Potassium: 4.5 mmol/L (ref 3.5–5.2)
Sodium: 143 mmol/L (ref 134–144)
Total Protein: 6.8 g/dL (ref 6.0–8.5)
eGFR: 108 mL/min/1.73 (ref >59)

## 2024-05-01 LAB — CBC WITH DIFFERENTIAL/PLATELET
Basophils Absolute: 0.1 x10E3/uL (ref 0.0–0.2)
Basos: 1 %
EOS (ABSOLUTE): 0.2 x10E3/uL (ref 0.0–0.4)
Eos: 3 %
Hematocrit: 39.6 % (ref 37.5–51.0)
Hemoglobin: 13.2 g/dL (ref 13.0–17.7)
Immature Grans (Abs): 0 x10E3/uL (ref 0.0–0.1)
Immature Granulocytes: 0 %
Lymphocytes Absolute: 1.2 x10E3/uL (ref 0.7–3.1)
Lymphs: 16 %
MCH: 30.8 pg (ref 26.6–33.0)
MCHC: 33.3 g/dL (ref 31.5–35.7)
MCV: 93 fL (ref 79–97)
Monocytes Absolute: 0.6 x10E3/uL (ref 0.1–0.9)
Monocytes: 8 %
Neutrophils Absolute: 5.5 x10E3/uL (ref 1.4–7.0)
Neutrophils: 72 %
Platelets: 188 x10E3/uL (ref 150–450)
RBC: 4.28 x10E6/uL (ref 4.14–5.80)
RDW: 11.8 % (ref 11.6–15.4)
WBC: 7.6 x10E3/uL (ref 3.4–10.8)

## 2024-05-01 LAB — HEMOGLOBIN A1C
Est. average glucose Bld gHb Est-mCnc: 111 mg/dL
Hgb A1c MFr Bld: 5.5 % (ref 4.8–5.6)

## 2024-05-01 LAB — LIPID PANEL
Chol/HDL Ratio: 3.2 ratio (ref 0.0–5.0)
Cholesterol, Total: 145 mg/dL (ref 100–199)
HDL: 45 mg/dL (ref >39)
LDL Chol Calc (NIH): 73 mg/dL (ref 0–99)
Triglycerides: 154 mg/dL — ABNORMAL HIGH (ref 0–149)
VLDL Cholesterol Cal: 27 mg/dL (ref 5–40)

## 2024-05-01 LAB — TSH: TSH: 4.81 u[IU]/mL — ABNORMAL HIGH (ref 0.450–4.500)

## 2024-05-01 LAB — VITAMIN B12

## 2024-05-01 LAB — VITAMIN D 25 HYDROXY (VIT D DEFICIENCY, FRACTURES)

## 2024-05-10 ENCOUNTER — Other Ambulatory Visit: Payer: Self-pay | Admitting: Family

## 2024-06-19 ENCOUNTER — Encounter: Payer: Self-pay | Admitting: Cardiology

## 2024-06-19 ENCOUNTER — Ambulatory Visit: Admitting: Cardiology

## 2024-06-19 VITALS — BP 116/64 | HR 108 | Temp 98.6°F | Ht 70.0 in | Wt 129.8 lb

## 2024-06-19 DIAGNOSIS — R350 Frequency of micturition: Secondary | ICD-10-CM | POA: Diagnosis not present

## 2024-06-19 DIAGNOSIS — Z0130 Encounter for examination of blood pressure without abnormal findings: Secondary | ICD-10-CM

## 2024-06-19 DIAGNOSIS — Z131 Encounter for screening for diabetes mellitus: Secondary | ICD-10-CM | POA: Diagnosis not present

## 2024-06-19 DIAGNOSIS — R051 Acute cough: Secondary | ICD-10-CM | POA: Diagnosis not present

## 2024-06-19 DIAGNOSIS — J00 Acute nasopharyngitis [common cold]: Secondary | ICD-10-CM

## 2024-06-19 MED ORDER — BENZONATATE 100 MG PO CAPS: 100.0000 mg | ORAL_CAPSULE | Freq: Three times a day (TID) | ORAL | 0 refills | Status: AC | PRN

## 2024-06-19 NOTE — Progress Notes (Signed)
 "  Established Patient Office Visit  Subjective:  Patient ID: Richard Hebert, male    DOB: 1995-10-09  Age: 28 y.o. MRN: [id number]  Chief Complaint  Patient presents with   Acute Visit    Dry cough and runny nose, negative for at home COVID test.     Patient in office for an acute visit. Caretaker in room for visit. Patient is non-verbal, unable to express his symptoms. Patient/caregiver reports a dry cough, runny nose, nasal congestion. Caregiver denies patient pulling ears. Has tried nasal spray, OTC cough suppressant and Benadryl with no relief. Will send in Tessalon  pearls. Continue nasal spray, decongestant.  Caregiver reports patient frequently going to the bathroom to urinate, requesting a referral to urology. Referral sent.  Blood pressure well controlled.   Cough This is a new problem. The problem has been unchanged. The cough is Non-productive. Associated symptoms include nasal congestion and rhinorrhea. Pertinent negatives include no chest pain, ear pain, headaches, myalgias or shortness of breath. Nothing aggravates the symptoms. He has tried OTC cough suppressant (Sudafed, cough syrup, Benadryl) for the symptoms. The treatment provided no relief.    No other concerns at this time.   Past Medical History:  Diagnosis Date   Hypokalemia 07/14/2022   Formatting of this note might be different from the original. Last Assessment & Plan:  Formatting of this note might be different from the original.  - This could certainly be contributing to his muscle weakness.  - We will aggressively replace potassium and follow.   Hyponatremia 07/26/2022   Metabolic acidosis 07/23/2022   Reactive thrombocytosis 07/23/2022   Thyroid  disease    Traumatic rhabdomyolysis 07/22/2022    History reviewed. No pertinent surgical history.  Social History   Socioeconomic History   Marital status: Single    Spouse name: Not on file   Number of children: Not on file   Years of education: Not on  file   Highest education level: Not on file  Occupational History   Not on file  Tobacco Use   Smoking status: Never   Smokeless tobacco: Never  Substance and Sexual Activity   Alcohol use: Never   Drug use: Never   Sexual activity: Not on file  Other Topics Concern   Not on file  Social History Narrative   Not on file   Social Drivers of Health   Tobacco Use: Low Risk (06/19/2024)   Patient History    Smoking Tobacco Use: Never    Smokeless Tobacco Use: Never    Passive Exposure: Not on file  Financial Resource Strain: Low Risk  (04/25/2024)   Received from [facility]   Overall Financial Resource Strain (CARDIA)    Difficulty of Paying Living Expenses: Not hard at all  Food Insecurity: No Food Insecurity (04/25/2024)   Received from [facility]   Epic    Within the past 12 months, you worried that your food would run out before you got the money to buy more.: Never true    Within the past 12 months, the food you bought just didn't last and you didn't have money to get more.: Never true  Transportation Needs: No Transportation Needs (04/25/2024)   Received from [facility] - Transportation    In the past 12 months, has lack of transportation kept you from medical appointments or from getting medications?: No    Lack of Transportation (Non-Medical): No  Physical Activity: Not on file  Stress: Not on file  Social Connections: Not on file  Intimate Partner Violence: Not At Risk (07/23/2022)   Humiliation, Afraid, Rape, and Kick questionnaire    Fear of Current or Ex-Partner: No    Emotionally Abused: No    Physically Abused: No    Sexually Abused: No  Depression (PHQ2-9): Low Risk (11/24/2022)   Depression (PHQ2-9)    PHQ-2 Score: 1  Alcohol Screen: Low Risk (07/13/2022)   Alcohol Screen    Last Alcohol Screening Score (AUDIT): 0  Housing: Low Risk  (04/25/2024)   Received from [facility]   Epic    In the last 12 months, was there a time when you were not able to pay the mortgage or rent on time?: No    In the past 12 months, how many times have you moved where you were living?: 0    At any time in the past 12 months, were you homeless or living in a shelter (including now)?: No  Utilities: Not At Risk (04/25/2024)   Received from [facility]   Epic    In the past 12 months has the electric, gas, oil, or water company threatened to shut off services in your home?: No  Health Literacy: Inadequate Health Literacy (11/02/2022)   Received from [facility]   B1300 Health Literacy    Frequency of need for help with medical instructions: Always    Family History  Problem Relation Age of Onset   Heart disease Maternal Aunt     Allergies[1]  Show/hide medication list[2]  Review of Systems  Constitutional: Negative.   HENT:  Positive for congestion and rhinorrhea. Negative for ear pain.   Eyes: Negative.   Respiratory:  Positive for cough. Negative for shortness of breath.   Cardiovascular: Negative.  Negative for chest pain.  Gastrointestinal: Negative.  Negative for abdominal pain, constipation and diarrhea.  Genitourinary: Negative.   Musculoskeletal:  Negative for joint pain and myalgias.  Skin: Negative.   Neurological: Negative.  Negative for dizziness and headaches.  Endo/Heme/Allergies: Negative.   All other systems reviewed and are negative.      Objective:   BP 116/64   Pulse (!) 108   Temp 98.6 F (37 C)   Ht 5' 10 (1.778 m)   Wt 129 lb 12.8 oz (58.9 kg)   SpO2 95%   BMI 18.62 kg/m   Vitals:   06/19/24 1107  BP: 116/64  Pulse: (!) 108  Temp: 98.6 F (37 C)  Height: 5' 10 (1.778 m)  Weight: 129 lb 12.8 oz (58.9 kg)  SpO2: 95%  BMI (Calculated): 18.62    Physical Exam Nursing note reviewed.  Constitutional:      Appearance: Normal appearance. He is normal weight.  HENT:     Head:  Normocephalic and atraumatic.     Nose: Nose normal.     Mouth/Throat:     Mouth: Mucous membranes are moist.     Pharynx: Oropharynx is clear.  Eyes:     Extraocular Movements: Extraocular movements intact.     Conjunctiva/sclera: Conjunctivae normal.     Pupils: Pupils are equal, round, and reactive to light.  Cardiovascular:     Rate and Rhythm: Normal rate and regular rhythm.     Pulses: Normal pulses.     Heart sounds: Normal heart sounds.  Pulmonary:     Effort: Pulmonary effort is normal.     Breath sounds: Normal breath sounds.  Abdominal:  General: Abdomen is flat. Bowel sounds are normal.     Palpations: Abdomen is soft.  Musculoskeletal:        General: Normal range of motion.     Cervical back: Normal range of motion.  Skin:    General: Skin is warm and dry.  Neurological:     General: No focal deficit present.     Mental Status: He is alert and oriented to person, place, and time.  Psychiatric:        Mood and Affect: Mood normal.        Behavior: Behavior normal.        Thought Content: Thought content normal.        Judgment: Judgment normal.      No results found for any visits on 06/19/24.  Recent Results (from the past 2160 hours)  CMP14+EGFR     Status: Abnormal   Collection Time: 04/27/24 11:36 AM  Result Value Ref Range   Glucose 89 70 - 99 mg/dL   BUN 8 6 - 20 mg/dL   Creatinine, Ser 9.00 0.76 - 1.27 mg/dL   eGFR 891 >40 fO/fpw/8.26   BUN/Creatinine Ratio 8 (L) 9 - 20   Sodium 143 134 - 144 mmol/L   Potassium 4.5 3.5 - 5.2 mmol/L   Chloride 105 96 - 106 mmol/L   CO2 20 20 - 29 mmol/L   Calcium 9.8 8.7 - 10.2 mg/dL   Total Protein 6.8 6.0 - 8.5 g/dL   Albumin 4.5 4.3 - 5.2 g/dL   Globulin, Total 2.3 1.5 - 4.5 g/dL   Bilirubin Total 0.5 0.0 - 1.2 mg/dL   Alkaline Phosphatase 81 47 - 123 IU/L   AST 23 0 - 40 IU/L   ALT 17 0 - 44 IU/L  Lipid panel     Status: Abnormal   Collection Time: 04/27/24 11:36 AM  Result Value Ref Range    Cholesterol, Total 145 100 - 199 mg/dL   Triglycerides 845 (H) 0 - 149 mg/dL   HDL 45 >60 mg/dL   VLDL Cholesterol Cal 27 5 - 40 mg/dL   LDL Chol Calc (NIH) 73 0 - 99 mg/dL   Chol/HDL Ratio 3.2 0.0 - 5.0 ratio    Comment:                                   T. Chol/HDL Ratio                                             Men  Women                               1/2 Avg.Risk  3.4    3.3                                   Avg.Risk  5.0    4.4                                2X Avg.Risk  9.6    7.1  3X Avg.Risk 23.4   11.0   VITAMIN D  25 Hydroxy (Vit-D Deficiency, Fractures)     Status: None   Collection Time: 04/27/24 11:36 AM  Result Value Ref Range   Vit D, 25-Hydroxy CANCELED ng/mL    Comment: [facility] was unable to collect sufficient specimen to perform the following test(s), and is providing the patient with re-collection instructions. Vitamin D  deficiency has been defined by the Institute of Medicine and an Endocrine Society practice guideline as a level of serum 25-OH vitamin D  less than 20 ng/mL (1,2). The Endocrine Society went on to further define vitamin D  insufficiency as a level between 21 and 29 ng/mL (2). 1. IOM (Institute of Medicine). 2010. Dietary reference    intakes for calcium and D. [place]: The    [employer]. 2. Holick MF, Binkley [place], Bischoff-Ferrari HA, et al.    Evaluation, treatment, and prevention of vitamin D     deficiency: an Endocrine Society clinical practice    guideline. JCEM. 2011 Jul; 96(7):1911-30.  Result canceled by the ancillary.   Vitamin B12     Status: None   Collection Time: 04/27/24 11:36 AM  Result Value Ref Range   Vitamin B-12 CANCELED pg/mL    Comment: [facility] was unable to collect sufficient specimen to perform the following test(s), and is providing the patient with re-collection instructions.  Result canceled by the ancillary.   CBC with Diff     Status: None   Collection Time:  04/27/24 11:36 AM  Result Value Ref Range   WBC 7.6 3.4 - 10.8 x10E3/uL   RBC 4.28 4.14 - 5.80 x10E6/uL   Hemoglobin 13.2 13.0 - 17.7 g/dL   Hematocrit 60.3 62.4 - 51.0 %   MCV 93 79 - 97 fL   MCH 30.8 26.6 - 33.0 pg   MCHC 33.3 31.5 - 35.7 g/dL   RDW 88.1 88.3 - 84.5 %   Platelets 188 150 - 450 x10E3/uL   Neutrophils 72 Not Estab. %   Lymphs 16 Not Estab. %   Monocytes 8 Not Estab. %   Eos 3 Not Estab. %   Basos 1 Not Estab. %   Neutrophils Absolute 5.5 1.4 - 7.0 x10E3/uL   Lymphocytes Absolute 1.2 0.7 - 3.1 x10E3/uL   Monocytes Absolute 0.6 0.1 - 0.9 x10E3/uL   EOS (ABSOLUTE) 0.2 0.0 - 0.4 x10E3/uL   Basophils Absolute 0.1 0.0 - 0.2 x10E3/uL   Immature Granulocytes 0 Not Estab. %   Immature Grans (Abs) 0.0 0.0 - 0.1 x10E3/uL  Hemoglobin A1c     Status: None   Collection Time: 04/27/24 11:36 AM  Result Value Ref Range   Hgb A1c MFr Bld 5.5 4.8 - 5.6 %    Comment:          Prediabetes: 5.7 - 6.4          Diabetes: >6.4          Glycemic control for adults with diabetes: <7.0    Est. average glucose Bld gHb Est-mCnc 111 mg/dL  TSH     Status: Abnormal   Collection Time: 04/27/24 11:36 AM  Result Value Ref Range   TSH 4.810 (H) 0.450 - 4.500 uIU/mL      Assessment & Plan:  Tessalon  pearls Nasal spray Decongestant Referral sent to urology  Problem List Items Addressed This Visit       Other   Frequent urination   Relevant Orders   Ambulatory referral to Urology   Other Visit Diagnoses  Acute rhinitis    -  Primary       Return if symptoms worsen or fail to improve, for as scheduled with Alan.   Total time spent: 25 minutes. This time includes review of previous notes and results and patient face to face interaction during today's visit.    Jeoffrey Pollen, NP  06/19/2024   This document may have been prepared by [facility] Voice Recognition software and as such may include unintentional dictation errors.     [1] No Known Allergies [2]   Outpatient Medications Prior to Visit  Medication Sig   Baclofen  5 MG TABS TAKE 1 TABLET BY MOUTH TWICE DAILY AND TAKE 1 TABLET BY MOUTH 3 TIMES A DAY AS NEEDED   clindamycin  (CLINDAGEL) 1 % gel APPLY TOPICALLY TO AFFECTED AREA(S) 2 TIMES DAILY   cyanocobalamin  (VITAMIN B12) 1000 MCG tablet TAKE 1 TABLET BY MOUTH ONCE A DAY   Ensure Plus (ENSURE PLUS) LIQD DRINK 1 BOTTLE (237 MLS) BY MOUTH ONCE DAILY   fluticasone (FLONASE) 50 MCG/ACT nasal spray PLACE 2 SPRAYS INTO BOTH NOSTRILS ONCE DAILY   folic acid  (FOLVITE ) 1 MG tablet TAKE 1 TABLET BY MOUTH ONCE A DAY   gabapentin  (NEURONTIN ) 300 MG capsule TAKE 1 CAPSULE BY MOUTH AT BEDTIME   GNP MELATONIN 3 MG TABS tablet TAKE 1 TABLET BY MOUTH AT BEDTIME   haloperidol  (HALDOL ) 2 MG tablet Take 1 tablet (2 mg total) by mouth every 6 (six) hours as needed for agitation.   hydrOXYzine  (ATARAX ) 10 MG tablet TAKE 1 TABLET BY MOUTH EVERY 6 HOURS AS NEEDED FOR ITCHING OR ANXIETY   KLOR-CON  M20 20 MEQ tablet TAKE 2 TABLETS (40 MEQ TOTAL) BY MOUTH ONCE A DAY   levothyroxine  (SYNTHROID ) 112 MCG tablet Take 1 tablet (112 mcg total) by mouth daily.   metoCLOPramide  (REGLAN ) 5 MG tablet TAKE 1 TABLET BY MOUTH 3 TIMES A DAY BEFORE MEALS   metoprolol  tartrate (LOPRESSOR ) 25 MG tablet TAKE 1/2 TABLET (12.5 MG TOTAL) BY MOUTHEVERY 12 HOURS   polyethylene glycol (MIRALAX  / GLYCOLAX ) 17 g packet Take 17 g by mouth daily as needed for mild constipation or moderate constipation.   Prenatal Vit-Fe Fumarate-FA (MULTIVITAMIN-PRENATAL) 27-0.8 MG TABS tablet TAKE 1 TABLET BY MOUTH DAILY AT 12 NOON   tamsulosin  (FLOMAX ) 0.4 MG CAPS capsule TAKE 1 CAPSULE BY MOUTH EVERY DAY 30 MINUTES AFTER SAME MEAL EACH DAY   traZODone  (DESYREL ) 100 MG tablet TAKE 1 TABLET BY MOUTH AT BEDTIME   Zinc  Oxide (DESITIN DAILY DEFENSE) 13 % CREA Apply 1 Application topically as needed (with changes and as needed.).   No facility-administered medications prior to visit.   "

## 2024-06-20 DIAGNOSIS — E031 Congenital hypothyroidism without goiter: Secondary | ICD-10-CM | POA: Diagnosis not present

## 2024-06-26 ENCOUNTER — Encounter: Payer: Self-pay | Admitting: Family

## 2024-06-26 NOTE — Assessment & Plan Note (Signed)
 Checking labs today.  Will continue supplements as needed.   - Vitamin D  - Vitamin B12 - TSH

## 2024-06-26 NOTE — Assessment & Plan Note (Signed)
 Patient stable.  Well controlled with current therapy.   Continue current meds.

## 2024-07-03 ENCOUNTER — Other Ambulatory Visit: Payer: Self-pay | Admitting: Family

## 2024-07-04 ENCOUNTER — Ambulatory Visit: Admitting: Cardiology

## 2024-07-04 ENCOUNTER — Encounter: Payer: Self-pay | Admitting: Cardiology

## 2024-07-04 VITALS — BP 102/68 | HR 79 | Ht 69.0 in | Wt 130.2 lb

## 2024-07-04 DIAGNOSIS — K219 Gastro-esophageal reflux disease without esophagitis: Secondary | ICD-10-CM

## 2024-07-04 DIAGNOSIS — R058 Other specified cough: Secondary | ICD-10-CM | POA: Diagnosis not present

## 2024-07-04 DIAGNOSIS — Z0130 Encounter for examination of blood pressure without abnormal findings: Secondary | ICD-10-CM

## 2024-07-04 DIAGNOSIS — R1110 Vomiting, unspecified: Secondary | ICD-10-CM | POA: Diagnosis not present

## 2024-07-04 MED ORDER — OMEPRAZOLE 20 MG PO CPDR: 20.0000 mg | DELAYED_RELEASE_CAPSULE | Freq: Every day | ORAL | 1 refills | Status: AC

## 2024-07-04 NOTE — Progress Notes (Signed)
 "  Established Patient Office Visit  Subjective:  Patient ID: Richard Hebert, male    DOB: 05/19/1996  Age: 29 y.o. MRN: [id number]  Chief Complaint  Patient presents with   Acute Visit    Dry cough and some vomiting after he eats, cough since before Christmas. Denies fever    Patient in office for an acute visit, complaining of a dry cough, occasional vomiting after meals. Here today with an aide from the home. Patient seen on 06/19/24 with complaints of a dry cough and a runny nose. Patient was prescribed Tessalon  pearls, recommended nasal spray, decongestant. Today, patient denies runny nose. Complains of a dry cough, occasional vomiting after meals. Patient on Reglan  three times daily. Will add omeprazole . Continue all other medications.     No other concerns at this time.   Past Medical History:  Diagnosis Date   Hypokalemia 07/14/2022   Formatting of this note might be different from the original. Last Assessment & Plan:  Formatting of this note might be different from the original.  - This could certainly be contributing to his muscle weakness.  - We will aggressively replace potassium and follow.   Hyponatremia 07/26/2022   Metabolic acidosis 07/23/2022   Reactive thrombocytosis 07/23/2022   Thyroid  disease    Traumatic rhabdomyolysis 07/22/2022    History reviewed. No pertinent surgical history.  Social History   Socioeconomic History   Marital status: Single    Spouse name: Not on file   Number of children: Not on file   Years of education: Not on file   Highest education level: Not on file  Occupational History   Not on file  Tobacco Use   Smoking status: Never   Smokeless tobacco: Never  Substance and Sexual Activity   Alcohol use: Never   Drug use: Never   Sexual activity: Not on file  Other Topics Concern   Not on file  Social History Narrative   Not on file   Social Drivers of Health   Tobacco Use: Low Risk (07/04/2024)   Patient History     Smoking Tobacco Use: Never    Smokeless Tobacco Use: Never    Passive Exposure: Not on file  Financial Resource Strain: Low Risk  (04/25/2024)   Received from [facility]   Overall Financial Resource Strain (CARDIA)    Difficulty of Paying Living Expenses: Not hard at all  Food Insecurity: No Food Insecurity (04/25/2024)   Received from [facility]   Epic    Within the past 12 months, you worried that your food would run out before you got the money to buy more.: Never true    Within the past 12 months, the food you bought just didn't last and you didn't have money to get more.: Never true  Transportation Needs: No Transportation Needs (04/25/2024)   Received from [facility] - Transportation    In the past 12 months, has lack of transportation kept you from medical appointments or from getting medications?: No    Lack of Transportation (Non-Medical): No  Physical Activity: Not on file  Stress: Not on file  Social Connections: Not on file  Intimate Partner Violence: Not At Risk (07/23/2022)   Humiliation, Afraid, Rape, and Kick questionnaire    Fear of Current or Ex-Partner: No    Emotionally Abused: No    Physically Abused: No    Sexually Abused: No  Depression (PHQ2-9): Low Risk (11/24/2022)  Depression (PHQ2-9)    PHQ-2 Score: 1  Alcohol Screen: Low Risk (07/13/2022)   Alcohol Screen    Last Alcohol Screening Score (AUDIT): 0  Housing: Low Risk  (04/25/2024)   Received from [facility]   Epic    In the last 12 months, was there a time when you were not able to pay the mortgage or rent on time?: No    In the past 12 months, how many times have you moved where you were living?: 0    At any time in the past 12 months, were you homeless or living in a shelter (including now)?: No  Utilities: Not At Risk (04/25/2024)   Received from [facility]   Epic    In the past 12 months has  the electric, gas, oil, or water company threatened to shut off services in your home?: No  Health Literacy: Inadequate Health Literacy (11/02/2022)   Received from [facility]   B1300 Health Literacy    Frequency of need for help with medical instructions: Always    Family History  Problem Relation Age of Onset   Heart disease Maternal Aunt     Allergies[1]  Show/hide medication list[2]  Review of Systems  Constitutional: Negative.   HENT: Negative.    Eyes: Negative.   Respiratory:  Positive for cough. Negative for shortness of breath.   Cardiovascular: Negative.  Negative for chest pain.  Gastrointestinal:  Positive for heartburn, nausea and vomiting. Negative for abdominal pain, constipation and diarrhea.  Genitourinary: Negative.   Musculoskeletal:  Negative for joint pain and myalgias.  Skin: Negative.   Neurological: Negative.  Negative for dizziness and headaches.  Endo/Heme/Allergies: Negative.   All other systems reviewed and are negative.      Objective:   BP 102/68   Pulse 79   Ht 5' 9 (1.753 m)   Wt 130 lb 3.2 oz (59.1 kg)   SpO2 91%   BMI 19.23 kg/m   Vitals:   07/04/24 1127  BP: 102/68  Pulse: 79  Height: 5' 9 (1.753 m)  Weight: 130 lb 3.2 oz (59.1 kg)  SpO2: 91%  BMI (Calculated): 19.22    Physical Exam Nursing note reviewed.  Constitutional:      Appearance: Normal appearance. He is normal weight.  HENT:     Head: Normocephalic and atraumatic.     Nose: Nose normal.     Mouth/Throat:     Mouth: Mucous membranes are moist.     Pharynx: Oropharynx is clear.  Eyes:     Extraocular Movements: Extraocular movements intact.     Conjunctiva/sclera: Conjunctivae normal.     Pupils: Pupils are equal, round, and reactive to light.  Cardiovascular:     Rate and Rhythm: Normal rate and regular rhythm.     Pulses: Normal pulses.     Heart sounds: Normal heart sounds.  Pulmonary:     Effort: Pulmonary effort is normal.      Breath sounds: Normal breath sounds.  Abdominal:     General: Abdomen is flat. Bowel sounds are normal.     Palpations: Abdomen is soft.  Musculoskeletal:        General: Normal range of motion.     Cervical back: Normal range of motion.  Skin:    General: Skin is warm and dry.  Neurological:     General: No focal deficit present.     Mental Status: He is alert and oriented to person,  place, and time.  Psychiatric:        Mood and Affect: Mood normal.        Behavior: Behavior normal.        Thought Content: Thought content normal.        Judgment: Judgment normal.      No results found for any visits on 07/04/24.  Recent Results (from the past 2160 hours)  CMP14+EGFR     Status: Abnormal   Collection Time: 04/27/24 11:36 AM  Result Value Ref Range   Glucose 89 70 - 99 mg/dL   BUN 8 6 - 20 mg/dL   Creatinine, Ser 9.00 0.76 - 1.27 mg/dL   eGFR 891 >40 fO/fpw/8.26   BUN/Creatinine Ratio 8 (L) 9 - 20   Sodium 143 134 - 144 mmol/L   Potassium 4.5 3.5 - 5.2 mmol/L   Chloride 105 96 - 106 mmol/L   CO2 20 20 - 29 mmol/L   Calcium 9.8 8.7 - 10.2 mg/dL   Total Protein 6.8 6.0 - 8.5 g/dL   Albumin 4.5 4.3 - 5.2 g/dL   Globulin, Total 2.3 1.5 - 4.5 g/dL   Bilirubin Total 0.5 0.0 - 1.2 mg/dL   Alkaline Phosphatase 81 47 - 123 IU/L   AST 23 0 - 40 IU/L   ALT 17 0 - 44 IU/L  Lipid panel     Status: Abnormal   Collection Time: 04/27/24 11:36 AM  Result Value Ref Range   Cholesterol, Total 145 100 - 199 mg/dL   Triglycerides 845 (H) 0 - 149 mg/dL   HDL 45 >60 mg/dL   VLDL Cholesterol Cal 27 5 - 40 mg/dL   LDL Chol Calc (NIH) 73 0 - 99 mg/dL   Chol/HDL Ratio 3.2 0.0 - 5.0 ratio    Comment:                                   T. Chol/HDL Ratio                                             Men  Women                               1/2 Avg.Risk  3.4    3.3                                   Avg.Risk  5.0    4.4                                2X Avg.Risk  9.6    7.1                                 3X Avg.Risk 23.4   11.0   VITAMIN D  25 Hydroxy (Vit-D Deficiency, Fractures)     Status: None   Collection Time: 04/27/24 11:36 AM  Result Value Ref Range   Vit D, 25-Hydroxy CANCELED ng/mL    Comment: [facility] was unable to collect sufficient specimen to perform the following test(s), and  is providing the patient with re-collection instructions. Vitamin D  deficiency has been defined by the Institute of Medicine and an Endocrine Society practice guideline as a level of serum 25-OH vitamin D  less than 20 ng/mL (1,2). The Endocrine Society went on to further define vitamin D  insufficiency as a level between 21 and 29 ng/mL (2). 1. IOM (Institute of Medicine). 2010. Dietary reference    intakes for calcium and D. [place]: The    [employer]. 2. Holick MF, Binkley [place], Bischoff-Ferrari HA, et al.    Evaluation, treatment, and prevention of vitamin D     deficiency: an Endocrine Society clinical practice    guideline. JCEM. 2011 Jul; 96(7):1911-30.  Result canceled by the ancillary.   Vitamin B12     Status: None   Collection Time: 04/27/24 11:36 AM  Result Value Ref Range   Vitamin B-12 CANCELED pg/mL    Comment: [facility] was unable to collect sufficient specimen to perform the following test(s), and is providing the patient with re-collection instructions.  Result canceled by the ancillary.   CBC with Diff     Status: None   Collection Time: 04/27/24 11:36 AM  Result Value Ref Range   WBC 7.6 3.4 - 10.8 x10E3/uL   RBC 4.28 4.14 - 5.80 x10E6/uL   Hemoglobin 13.2 13.0 - 17.7 g/dL   Hematocrit 60.3 62.4 - 51.0 %   MCV 93 79 - 97 fL   MCH 30.8 26.6 - 33.0 pg   MCHC 33.3 31.5 - 35.7 g/dL   RDW 88.1 88.3 - 84.5 %   Platelets 188 150 - 450 x10E3/uL   Neutrophils 72 Not Estab. %   Lymphs 16 Not Estab. %   Monocytes 8 Not Estab. %   Eos 3 Not Estab. %   Basos 1 Not Estab. %   Neutrophils Absolute 5.5 1.4 - 7.0 x10E3/uL   Lymphocytes Absolute 1.2 0.7 -  3.1 x10E3/uL   Monocytes Absolute 0.6 0.1 - 0.9 x10E3/uL   EOS (ABSOLUTE) 0.2 0.0 - 0.4 x10E3/uL   Basophils Absolute 0.1 0.0 - 0.2 x10E3/uL   Immature Granulocytes 0 Not Estab. %   Immature Grans (Abs) 0.0 0.0 - 0.1 x10E3/uL  Hemoglobin A1c     Status: None   Collection Time: 04/27/24 11:36 AM  Result Value Ref Range   Hgb A1c MFr Bld 5.5 4.8 - 5.6 %    Comment:          Prediabetes: 5.7 - 6.4          Diabetes: >6.4          Glycemic control for adults with diabetes: <7.0    Est. average glucose Bld gHb Est-mCnc 111 mg/dL  TSH     Status: Abnormal   Collection Time: 04/27/24 11:36 AM  Result Value Ref Range   TSH 4.810 (H) 0.450 - 4.500 uIU/mL      Assessment & Plan:  Omeprazole  Continue all other medications  Problem List Items Addressed This Visit       Digestive   Gastroesophageal reflux disease without esophagitis - Primary   Relevant Medications   omeprazole  (PRILOSEC) 20 MG capsule   Vomiting     Other   Dry cough    Return if symptoms worsen or fail to improve, for as scheduled with Alan.   Total time spent: 25 minutes. This time includes review of previous notes and results and patient face to face interaction during today's visit.    Braxon Suder, NP  07/04/2024   This document may have been prepared by Dragon Voice Recognition software and as such may include unintentional dictation errors.     [1] No Known Allergies [2]  Outpatient Medications Prior to Visit  Medication Sig   Baclofen  5 MG TABS TAKE 1 TABLET BY MOUTH TWICE DAILY AND TAKE 1 TABLET BY MOUTH 3 TIMES A DAY AS NEEDED   benzonatate  (TESSALON ) 100 MG capsule Take 1 capsule (100 mg total) by mouth 3 (three) times daily as needed for cough.   clindamycin  (CLINDAGEL) 1 % gel APPLY TOPICALLY TO AFFECTED AREA(S) 2 TIMES DAILY   cyanocobalamin  (VITAMIN B12) 1000 MCG tablet TAKE 1 TABLET BY MOUTH ONCE A DAY   Ensure Plus (ENSURE PLUS) LIQD DRINK 1 BOTTLE (237 MLS) BY MOUTH ONCE DAILY    fluticasone (FLONASE) 50 MCG/ACT nasal spray PLACE 2 SPRAYS INTO BOTH NOSTRILS ONCE DAILY   folic acid  (FOLVITE ) 1 MG tablet TAKE 1 TABLET BY MOUTH ONCE A DAY   gabapentin  (NEURONTIN ) 300 MG capsule TAKE 1 CAPSULE BY MOUTH AT BEDTIME   GNP MELATONIN 3 MG TABS tablet TAKE 1 TABLET BY MOUTH AT BEDTIME   haloperidol  (HALDOL ) 2 MG tablet Take 1 tablet (2 mg total) by mouth every 6 (six) hours as needed for agitation.   hydrOXYzine  (ATARAX ) 10 MG tablet TAKE 1 TABLET BY MOUTH EVERY 6 HOURS AS NEEDED FOR ITCHING OR ANXIETY   KLOR-CON  M20 20 MEQ tablet TAKE 2 TABLETS (40 MEQ TOTAL) BY MOUTH ONCE A DAY   levothyroxine  (SYNTHROID ) 112 MCG tablet Take 1 tablet (112 mcg total) by mouth daily.   metoCLOPramide  (REGLAN ) 5 MG tablet TAKE 1 TABLET BY MOUTH 3 TIMES A DAY BEFORE MEALS   metoprolol  tartrate (LOPRESSOR ) 25 MG tablet TAKE 1/2 TABLET (12.5 MG TOTAL) BY MOUTHEVERY 12 HOURS   polyethylene glycol (MIRALAX  / GLYCOLAX ) 17 g packet Take 17 g by mouth daily as needed for mild constipation or moderate constipation.   Prenatal Vit-Fe Fumarate-FA (MULTIVITAMIN-PRENATAL) 27-0.8 MG TABS tablet TAKE 1 TABLET BY MOUTH DAILY AT 12 NOON   QUEtiapine (SEROQUEL) 50 MG tablet Take 50 mg by mouth.   tamsulosin  (FLOMAX ) 0.4 MG CAPS capsule TAKE 1 CAPSULE BY MOUTH EVERY DAY 30 MINUTES AFTER SAME MEAL EACH DAY   traZODone  (DESYREL ) 100 MG tablet TAKE 1 TABLET BY MOUTH AT BEDTIME   Zinc  Oxide (DESITIN DAILY DEFENSE) 13 % CREA Apply 1 Application topically as needed (with changes and as needed.).   No facility-administered medications prior to visit.   "

## 2024-07-05 NOTE — Progress Notes (Signed)
 CC: Chief Complaint  Patient presents with   Left Great Toe Concern    Richard Hebert presents for a recurrent ingrown toenail on his left great toe.   Objective: Medial border of the left hallux nail is ingrown into the nail fold with some mild crusted drainage and early granuloma formation.  No signs of purulence.  Assessment: Encounter Diagnoses  Name Primary?   Ingrowing nail Yes   Paronychia of toe of left foot     Plan: Debrided out the ingrown border of nail without the need for anesthesia.  Bactroban and sterile bandage applied.  Discussed with the patient and his caregiver again that if there are continued problems could consider permanent removal of the border of the nail.  Patient will return to clinic as needed.  No orders of the defined types were placed in this encounter.

## 2024-07-10 DIAGNOSIS — N319 Neuromuscular dysfunction of bladder, unspecified: Secondary | ICD-10-CM | POA: Insufficient documentation

## 2024-07-10 NOTE — Assessment & Plan Note (Addendum)
 Chronic bladder dysfunction  - Likely behavioral  - Severe retention seen on CT in 2024, mild Right hydro (during hospitalization for unrelated issues)  - Cr 0.99 (from slow uptrend of 0.68 in 2024)   - on Flomax , antipsychotics  Reviewed limited available history Unclear significance of prior CT, significant bladder distention with right hydronephrosis--this may have been secondary to other systemic issues at time of acute hospitalization PVR 0 today -seems to be emptying much better Reported frequent urination seems to be largely behavioral based on history  - schedule RBUS to ensure healthy renal anatomy and upper tracts - Continue Flomax  - Otherwise, in the absence of red flag signs, renal deterioration, hydronephrosis, UTIs-would follow expectantly.

## 2024-07-10 NOTE — Progress Notes (Unsigned)
" ° °  07/10/2024 2:26 PM   Richard Hebert [birth year] [id number]   HPI: 29 y.o. male here for initial evaluation of LUTS  History of severe autism, lives in a group home, congenital hypothyroidism  Onset/duration: {bglistvasectomytime:33394} Primary complaint: {bglistLUTStype:33836}   -{bglistLUTSprimarytype:33837} Degree of bother: {bglistPDdegreeofBother:33456} IPSS: ***  Current or prior therapies:   - On Flomax  0.4 mg  Denies GH, UTIs, nephrolithiasis, prostatitis No prior GU surgeries*** Denies Fhx of GU malignancies     PMH: Past Medical History:  Diagnosis Date   Hypokalemia 07/14/2022   Formatting of this note might be different from the original. Last Assessment & Plan:  Formatting of this note might be different from the original.  - This could certainly be contributing to his muscle weakness.  - We will aggressively replace potassium and follow.   Hyponatremia 07/26/2022   Metabolic acidosis 07/23/2022   Reactive thrombocytosis 07/23/2022   Thyroid  disease    Traumatic rhabdomyolysis 07/22/2022    Surgical History: No past surgical history on file.  Family History: Family History  Problem Relation Age of Onset   Heart disease Maternal Aunt     Social History:  reports that he has never smoked. He has never used smokeless tobacco. He reports that he does not drink alcohol and does not use drugs.      Physical Exam: There were no vitals taken for this visit.   Constitutional:  Alert and oriented, No acute distress. Cardiovascular: No clubbing, cyanosis, or edema. Respiratory: Normal respiratory effort, no increased work of breathing. GI: Nondistended GU: *** Skin: No rashes, bruises or suspicious lesions. Neurologic: Grossly intact, no focal deficits, moving all 4 extremities. Psychiatric: Normal mood and affect.  Laboratory Data: Creatinine, Ser 0.99 0.95 0.87 0.93 0.87 0.89 0.68     Pertinent Imaging: I have personally viewed and interpreted  the CT AP (07/22/2022)-severe bladder distention to level of the umbilicus, mild right hydronephrosis no left hydronephrosis.  Otherwise morphologically normal-appearing bilateral kidneys.    Assessment & Plan:    Bladder dysfunction Assessment & Plan: Chronic bladder dysfunction  - Severe retention seen on CT in 2024, mild Right hydro  - Likely neurogenic component vs central neurodevelopmental disorder  - Cr 0.99 (from slow uptrend of 0.68 in 2024)   - on Flomax , antipsychotics  Reviewed limited available history Presumed to have unstaged neurogenic bladder until proven otherwise Possible early signs of upper tract sequelae Recommend definitive bladder decompression (indwelling Foley, CIC through care team, or SPT) -favor SPT, at least initially Needs urodynamics, establish high versus low pressure  - repeat RBUS - schedule for cystoscopy + SPT placement in OR - Refer to [facility] for UDS       Penne Skye, MD 07/10/2024  [facility] [address] [place], [place] [address] [phone] "

## 2024-07-14 ENCOUNTER — Ambulatory Visit: Admitting: Urology

## 2024-07-14 VITALS — BP 169/82 | HR 64 | Ht 69.0 in | Wt 130.2 lb

## 2024-07-14 DIAGNOSIS — N319 Neuromuscular dysfunction of bladder, unspecified: Secondary | ICD-10-CM | POA: Diagnosis not present

## 2024-07-14 LAB — MICROSCOPIC EXAMINATION
Bacteria, UA: NONE SEEN
WBC, UA: NONE SEEN /HPF (ref 0–5)

## 2024-07-14 LAB — URINALYSIS, COMPLETE
Bilirubin, UA: NEGATIVE
Glucose, UA: NEGATIVE
Ketones, UA: NEGATIVE
Leukocytes,UA: NEGATIVE
Nitrite, UA: NEGATIVE
Protein,UA: NEGATIVE
RBC, UA: NEGATIVE
Specific Gravity, UA: <1.005 — ABNORMAL LOW (ref 1.005–1.030)
Urobilinogen, Ur: 0.2 mg/dL (ref 0.2–1.0)
pH, UA: 6 (ref 5.0–7.5)

## 2024-07-14 LAB — BLADDER SCAN AMB NON-IMAGING: Scan Result: 0

## 2024-07-14 NOTE — Patient Instructions (Signed)
 Please call [phone] to schedule your imaging prior to your appointment. Please allow time for your imaging results as this can take up 2-3 weeks to review. A follow up appointment as already been scheduled for the doctor to review results with you.

## 2024-07-20 ENCOUNTER — Ambulatory Visit: Attending: Urology

## 2024-08-16 ENCOUNTER — Ambulatory Visit: Admitting: Urology

## 2024-08-28 ENCOUNTER — Ambulatory Visit: Admitting: Family
